# Patient Record
Sex: Female | Born: 1965
Health system: Southern US, Community
[De-identification: ages and names within clinical notes are randomized; demographics above are authoritative.]

## PROBLEM LIST (undated history)

## (undated) DIAGNOSIS — F32A Depression, unspecified: Secondary | ICD-10-CM

## (undated) DIAGNOSIS — M719 Bursopathy, unspecified: Secondary | ICD-10-CM

## (undated) DIAGNOSIS — M722 Plantar fascial fibromatosis: Secondary | ICD-10-CM

## (undated) DIAGNOSIS — M2550 Pain in unspecified joint: Secondary | ICD-10-CM

## (undated) DIAGNOSIS — G8929 Other chronic pain: Secondary | ICD-10-CM

## (undated) DIAGNOSIS — F329 Major depressive disorder, single episode, unspecified: Secondary | ICD-10-CM

## (undated) DIAGNOSIS — N94819 Vulvodynia, unspecified: Secondary | ICD-10-CM

## (undated) HISTORY — PX: SKIN SURGERY: SHX2413

## (undated) HISTORY — DX: Depression, unspecified: F32.A

## (undated) HISTORY — DX: Major depressive disorder, single episode, unspecified: F32.9

## (undated) HISTORY — DX: Bursopathy, unspecified: M71.9

## (undated) HISTORY — DX: Pain in unspecified joint: M25.50

## (undated) HISTORY — PX: BREAST ENHANCEMENT SURGERY: SHX7

## (undated) HISTORY — DX: Plantar fascial fibromatosis: M72.2

## (undated) HISTORY — DX: Vulvodynia, unspecified: N94.819

---

## 2005-10-06 ENCOUNTER — Emergency Department (HOSPITAL_COMMUNITY): Admission: EM | Admit: 2005-10-06 | Discharge: 2005-10-06 | Payer: Self-pay | Admitting: Family Medicine

## 2005-10-10 ENCOUNTER — Ambulatory Visit (HOSPITAL_COMMUNITY): Admission: RE | Admit: 2005-10-10 | Discharge: 2005-10-10 | Payer: Self-pay | Admitting: Obstetrics & Gynecology

## 2005-10-31 ENCOUNTER — Emergency Department (HOSPITAL_COMMUNITY): Admission: EM | Admit: 2005-10-31 | Discharge: 2005-10-31 | Payer: Self-pay | Admitting: Emergency Medicine

## 2007-06-16 ENCOUNTER — Ambulatory Visit (HOSPITAL_COMMUNITY): Admission: RE | Admit: 2007-06-16 | Discharge: 2007-06-16 | Payer: Self-pay | Admitting: Obstetrics

## 2007-07-07 ENCOUNTER — Ambulatory Visit: Payer: Self-pay | Admitting: Family Medicine

## 2007-08-14 DIAGNOSIS — L708 Other acne: Secondary | ICD-10-CM | POA: Insufficient documentation

## 2007-08-14 DIAGNOSIS — Z87440 Personal history of urinary (tract) infections: Secondary | ICD-10-CM | POA: Insufficient documentation

## 2007-12-04 ENCOUNTER — Emergency Department (HOSPITAL_COMMUNITY): Admission: EM | Admit: 2007-12-04 | Discharge: 2007-12-04 | Payer: Self-pay | Admitting: Family Medicine

## 2007-12-04 ENCOUNTER — Telehealth (INDEPENDENT_AMBULATORY_CARE_PROVIDER_SITE_OTHER): Payer: Self-pay | Admitting: *Deleted

## 2007-12-15 ENCOUNTER — Ambulatory Visit: Payer: Self-pay | Admitting: *Deleted

## 2008-07-02 ENCOUNTER — Telehealth (INDEPENDENT_AMBULATORY_CARE_PROVIDER_SITE_OTHER): Payer: Self-pay | Admitting: Family Medicine

## 2008-07-21 ENCOUNTER — Ambulatory Visit (HOSPITAL_COMMUNITY): Admission: RE | Admit: 2008-07-21 | Discharge: 2008-07-21 | Payer: Self-pay | Admitting: Family Medicine

## 2008-07-27 ENCOUNTER — Encounter (INDEPENDENT_AMBULATORY_CARE_PROVIDER_SITE_OTHER): Payer: Self-pay | Admitting: Family Medicine

## 2008-07-27 ENCOUNTER — Encounter: Admission: RE | Admit: 2008-07-27 | Discharge: 2008-07-27 | Payer: Self-pay | Admitting: Family Medicine

## 2008-07-29 DIAGNOSIS — N63 Unspecified lump in unspecified breast: Secondary | ICD-10-CM | POA: Insufficient documentation

## 2008-10-11 ENCOUNTER — Ambulatory Visit: Payer: Self-pay | Admitting: Family Medicine

## 2008-10-11 DIAGNOSIS — F411 Generalized anxiety disorder: Secondary | ICD-10-CM | POA: Insufficient documentation

## 2008-10-11 DIAGNOSIS — N390 Urinary tract infection, site not specified: Secondary | ICD-10-CM | POA: Insufficient documentation

## 2008-10-11 LAB — CONVERTED CEMR LAB: Pap Smear: NORMAL

## 2008-11-23 ENCOUNTER — Ambulatory Visit: Payer: Self-pay | Admitting: Family Medicine

## 2008-11-23 LAB — CONVERTED CEMR LAB
ALT: 29 units/L (ref 0–35)
AST: 29 units/L (ref 0–37)
Albumin: 4.2 g/dL (ref 3.5–5.2)
Alkaline Phosphatase: 34 units/L — ABNORMAL LOW (ref 39–117)
BUN: 17 mg/dL (ref 6–23)
Basophils Absolute: 0 10*3/uL (ref 0.0–0.1)
Basophils Relative: 1 % (ref 0–1)
CO2: 22 meq/L (ref 19–32)
Calcium: 9.2 mg/dL (ref 8.4–10.5)
Chloride: 108 meq/L (ref 96–112)
Cholesterol: 216 mg/dL — ABNORMAL HIGH (ref 0–200)
Creatinine, Ser: 0.84 mg/dL (ref 0.40–1.20)
Eosinophils Absolute: 0 10*3/uL (ref 0.0–0.7)
Eosinophils Relative: 1 % (ref 0–5)
Glucose, Bld: 89 mg/dL (ref 70–99)
HCT: 41.3 % (ref 36.0–46.0)
HDL: 71 mg/dL (ref >39)
Hemoglobin: 13.5 g/dL (ref 12.0–15.0)
LDL Cholesterol: 126 mg/dL — ABNORMAL HIGH (ref 0–99)
Lymphocytes Relative: 25 % (ref 12–46)
Lymphs Abs: 1.1 10*3/uL (ref 0.7–4.0)
MCHC: 32.7 g/dL (ref 30.0–36.0)
MCV: 97.4 fL (ref 78.0–100.0)
Monocytes Absolute: 0.4 10*3/uL (ref 0.1–1.0)
Monocytes Relative: 10 % (ref 3–12)
Neutro Abs: 2.7 10*3/uL (ref 1.7–7.7)
Neutrophils Relative %: 64 % (ref 43–77)
Platelets: 179 10*3/uL (ref 150–400)
Potassium: 4.4 meq/L (ref 3.5–5.3)
RBC: 4.24 M/uL (ref 3.87–5.11)
RDW: 13.8 % (ref 11.5–15.5)
Sodium: 142 meq/L (ref 135–145)
TSH: 2.286 microintl units/mL (ref 0.350–4.50)
Total Bilirubin: 0.4 mg/dL (ref 0.3–1.2)
Total CHOL/HDL Ratio: 3
Total Protein: 6.6 g/dL (ref 6.0–8.3)
Triglycerides: 94 mg/dL (ref <150)
VLDL: 19 mg/dL (ref 0–40)
WBC: 4.3 10*3/uL (ref 4.0–10.5)

## 2008-11-24 ENCOUNTER — Encounter (INDEPENDENT_AMBULATORY_CARE_PROVIDER_SITE_OTHER): Payer: Self-pay | Admitting: Family Medicine

## 2008-12-03 ENCOUNTER — Telehealth (INDEPENDENT_AMBULATORY_CARE_PROVIDER_SITE_OTHER): Payer: Self-pay | Admitting: Family Medicine

## 2008-12-19 ENCOUNTER — Emergency Department (HOSPITAL_COMMUNITY): Admission: EM | Admit: 2008-12-19 | Discharge: 2008-12-20 | Payer: Self-pay | Admitting: Emergency Medicine

## 2009-01-28 ENCOUNTER — Inpatient Hospital Stay (HOSPITAL_COMMUNITY): Admission: AD | Admit: 2009-01-28 | Discharge: 2009-01-28 | Payer: Self-pay | Admitting: Obstetrics and Gynecology

## 2009-01-28 ENCOUNTER — Emergency Department (HOSPITAL_COMMUNITY): Admission: EM | Admit: 2009-01-28 | Discharge: 2009-01-28 | Payer: Self-pay | Admitting: Emergency Medicine

## 2009-01-31 ENCOUNTER — Inpatient Hospital Stay (HOSPITAL_COMMUNITY): Admission: AD | Admit: 2009-01-31 | Discharge: 2009-01-31 | Payer: Self-pay | Admitting: Obstetrics & Gynecology

## 2009-01-31 ENCOUNTER — Ambulatory Visit: Payer: Self-pay | Admitting: Obstetrics & Gynecology

## 2009-02-23 ENCOUNTER — Ambulatory Visit: Payer: Self-pay | Admitting: Obstetrics & Gynecology

## 2009-02-23 ENCOUNTER — Encounter: Payer: Self-pay | Admitting: Physician Assistant

## 2009-02-23 LAB — CONVERTED CEMR LAB
Clue Cells Wet Prep HPF POC: NONE SEEN
Trich, Wet Prep: NONE SEEN
WBC, Wet Prep HPF POC: NONE SEEN
Yeast Wet Prep HPF POC: NONE SEEN

## 2009-02-24 ENCOUNTER — Encounter: Admission: RE | Admit: 2009-02-24 | Discharge: 2009-02-24 | Payer: Self-pay | Admitting: Family Medicine

## 2009-02-28 ENCOUNTER — Telehealth (INDEPENDENT_AMBULATORY_CARE_PROVIDER_SITE_OTHER): Payer: Self-pay | Admitting: Family Medicine

## 2009-03-01 ENCOUNTER — Inpatient Hospital Stay (HOSPITAL_COMMUNITY): Admission: AD | Admit: 2009-03-01 | Discharge: 2009-03-01 | Payer: Self-pay | Admitting: Obstetrics & Gynecology

## 2009-03-01 ENCOUNTER — Ambulatory Visit: Payer: Self-pay | Admitting: Obstetrics and Gynecology

## 2009-03-04 ENCOUNTER — Emergency Department (HOSPITAL_COMMUNITY): Admission: EM | Admit: 2009-03-04 | Discharge: 2009-03-04 | Payer: Self-pay | Admitting: Emergency Medicine

## 2009-03-21 ENCOUNTER — Emergency Department (HOSPITAL_COMMUNITY): Admission: EM | Admit: 2009-03-21 | Discharge: 2009-03-21 | Payer: Self-pay | Admitting: Emergency Medicine

## 2009-03-21 ENCOUNTER — Telehealth (INDEPENDENT_AMBULATORY_CARE_PROVIDER_SITE_OTHER): Payer: Self-pay | Admitting: Family Medicine

## 2009-03-25 ENCOUNTER — Ambulatory Visit: Payer: Self-pay | Admitting: Obstetrics & Gynecology

## 2009-03-25 ENCOUNTER — Encounter: Payer: Self-pay | Admitting: Family

## 2009-03-25 LAB — CONVERTED CEMR LAB
Clue Cells Wet Prep HPF POC: NONE SEEN
Trich, Wet Prep: NONE SEEN
WBC, Wet Prep HPF POC: NONE SEEN
Yeast Wet Prep HPF POC: NONE SEEN

## 2009-04-14 ENCOUNTER — Ambulatory Visit: Payer: Self-pay | Admitting: Obstetrics and Gynecology

## 2009-04-15 ENCOUNTER — Encounter: Payer: Self-pay | Admitting: Family Medicine

## 2009-04-15 LAB — CONVERTED CEMR LAB
Trich, Wet Prep: NONE SEEN
WBC, Wet Prep HPF POC: NONE SEEN
Yeast Wet Prep HPF POC: NONE SEEN

## 2009-04-20 ENCOUNTER — Ambulatory Visit: Payer: Self-pay | Admitting: Obstetrics and Gynecology

## 2009-04-22 ENCOUNTER — Ambulatory Visit: Payer: Self-pay | Admitting: Nurse Practitioner

## 2009-04-22 DIAGNOSIS — N94819 Vulvodynia, unspecified: Secondary | ICD-10-CM | POA: Insufficient documentation

## 2009-04-22 DIAGNOSIS — B379 Candidiasis, unspecified: Secondary | ICD-10-CM | POA: Insufficient documentation

## 2009-04-22 LAB — CONVERTED CEMR LAB
ALT: 13 units/L (ref 0–35)
AST: 25 units/L (ref 0–37)
Albumin: 4.8 g/dL (ref 3.5–5.2)
Alkaline Phosphatase: 30 units/L — ABNORMAL LOW (ref 39–117)
BUN: 10 mg/dL (ref 6–23)
Basophils Absolute: 0 10*3/uL (ref 0.0–0.1)
Basophils Relative: 0 % (ref 0–1)
CA 125: 15.7 units/mL (ref 0.0–30.2)
CO2: 22 meq/L (ref 19–32)
Calcium: 9.6 mg/dL (ref 8.4–10.5)
Chloride: 104 meq/L (ref 96–112)
Cholesterol: 235 mg/dL — ABNORMAL HIGH (ref 0–200)
Creatinine, Ser: 0.81 mg/dL (ref 0.40–1.20)
Eosinophils Absolute: 0 10*3/uL (ref 0.0–0.7)
Eosinophils Relative: 0 % (ref 0–5)
FSH: 1.7 milliintl units/mL
Glucose, Bld: 88 mg/dL (ref 70–99)
HCT: 42.2 % (ref 36.0–46.0)
HDL: 82 mg/dL (ref >39)
Hemoglobin: 14.5 g/dL (ref 12.0–15.0)
LDL Cholesterol: 140 mg/dL — ABNORMAL HIGH (ref 0–99)
Lymphocytes Relative: 29 % (ref 12–46)
Lymphs Abs: 1.2 10*3/uL (ref 0.7–4.0)
MCHC: 34.4 g/dL (ref 30.0–36.0)
MCV: 92.7 fL (ref 78.0–100.0)
Monocytes Absolute: 0.3 10*3/uL (ref 0.1–1.0)
Monocytes Relative: 7 % (ref 3–12)
Neutro Abs: 2.6 10*3/uL (ref 1.7–7.7)
Neutrophils Relative %: 64 % (ref 43–77)
Platelets: 196 10*3/uL (ref 150–400)
Potassium: 5.3 meq/L (ref 3.5–5.3)
Progesterone: 15.8 ng/mL
RBC: 4.55 M/uL (ref 3.87–5.11)
RDW: 13.7 % (ref 11.5–15.5)
Sodium: 141 meq/L (ref 135–145)
TSH: 1.148 microintl units/mL (ref 0.350–4.500)
Total Bilirubin: 0.8 mg/dL (ref 0.3–1.2)
Total CHOL/HDL Ratio: 2.9
Total Protein: 7.3 g/dL (ref 6.0–8.3)
Triglycerides: 63 mg/dL (ref <150)
VLDL: 13 mg/dL (ref 0–40)
WBC: 4 10*3/uL (ref 4.0–10.5)

## 2009-05-02 ENCOUNTER — Telehealth (INDEPENDENT_AMBULATORY_CARE_PROVIDER_SITE_OTHER): Payer: Self-pay | Admitting: Nurse Practitioner

## 2009-05-02 DIAGNOSIS — R109 Unspecified abdominal pain: Secondary | ICD-10-CM | POA: Insufficient documentation

## 2009-05-04 ENCOUNTER — Ambulatory Visit: Payer: Self-pay | Admitting: Obstetrics and Gynecology

## 2009-05-04 ENCOUNTER — Encounter (INDEPENDENT_AMBULATORY_CARE_PROVIDER_SITE_OTHER): Payer: Self-pay | Admitting: Nurse Practitioner

## 2009-05-09 ENCOUNTER — Ambulatory Visit (HOSPITAL_COMMUNITY): Admission: RE | Admit: 2009-05-09 | Discharge: 2009-05-09 | Payer: Self-pay | Admitting: Internal Medicine

## 2009-05-11 ENCOUNTER — Telehealth (INDEPENDENT_AMBULATORY_CARE_PROVIDER_SITE_OTHER): Payer: Self-pay | Admitting: Nurse Practitioner

## 2009-05-16 ENCOUNTER — Ambulatory Visit: Payer: Self-pay | Admitting: Nurse Practitioner

## 2009-06-01 ENCOUNTER — Ambulatory Visit: Payer: Self-pay | Admitting: Obstetrics and Gynecology

## 2009-06-09 ENCOUNTER — Encounter (INDEPENDENT_AMBULATORY_CARE_PROVIDER_SITE_OTHER): Payer: Self-pay | Admitting: Nurse Practitioner

## 2009-08-09 ENCOUNTER — Encounter (INDEPENDENT_AMBULATORY_CARE_PROVIDER_SITE_OTHER): Payer: Self-pay | Admitting: Nurse Practitioner

## 2009-09-07 ENCOUNTER — Ambulatory Visit: Payer: Self-pay | Admitting: Nurse Practitioner

## 2009-09-07 DIAGNOSIS — F3289 Other specified depressive episodes: Secondary | ICD-10-CM | POA: Insufficient documentation

## 2009-09-07 DIAGNOSIS — F329 Major depressive disorder, single episode, unspecified: Secondary | ICD-10-CM | POA: Insufficient documentation

## 2009-09-07 LAB — CONVERTED CEMR LAB
Bilirubin Urine: NEGATIVE
Blood in Urine, dipstick: NEGATIVE
Glucose, Urine, Semiquant: NEGATIVE
KOH Prep: NEGATIVE
Ketones, urine, test strip: NEGATIVE
Nitrite: NEGATIVE
Protein, U semiquant: NEGATIVE
Specific Gravity, Urine: 1.01
Urobilinogen, UA: 0.2
WBC Urine, dipstick: NEGATIVE
pH: 7.5

## 2010-01-02 ENCOUNTER — Ambulatory Visit: Payer: Self-pay | Admitting: Nurse Practitioner

## 2010-01-02 DIAGNOSIS — E7800 Pure hypercholesterolemia, unspecified: Secondary | ICD-10-CM | POA: Insufficient documentation

## 2010-01-02 LAB — CONVERTED CEMR LAB
Bilirubin Urine: NEGATIVE
Blood in Urine, dipstick: NEGATIVE
Glucose, Urine, Semiquant: NEGATIVE
KOH Prep: NEGATIVE
Ketones, urine, test strip: NEGATIVE
Nitrite: NEGATIVE
Protein, U semiquant: NEGATIVE
Specific Gravity, Urine: 1.01
Urobilinogen, UA: 0.2
WBC Urine, dipstick: NEGATIVE
pH: 7.5

## 2010-01-04 ENCOUNTER — Encounter (INDEPENDENT_AMBULATORY_CARE_PROVIDER_SITE_OTHER): Payer: Self-pay | Admitting: Nurse Practitioner

## 2010-01-04 LAB — CONVERTED CEMR LAB
ALT: 23 units/L (ref 0–35)
AST: 27 units/L (ref 0–37)
Albumin: 4.7 g/dL (ref 3.5–5.2)
Alkaline Phosphatase: 29 units/L — ABNORMAL LOW (ref 39–117)
BUN: 13 mg/dL (ref 6–23)
CO2: 26 meq/L (ref 19–32)
Calcium: 9.7 mg/dL (ref 8.4–10.5)
Chloride: 102 meq/L (ref 96–112)
Cholesterol: 229 mg/dL — ABNORMAL HIGH (ref 0–200)
Creatinine, Ser: 0.86 mg/dL (ref 0.40–1.20)
Glucose, Bld: 84 mg/dL (ref 70–99)
HDL: 75 mg/dL (ref >39)
LDL Cholesterol: 138 mg/dL — ABNORMAL HIGH (ref 0–99)
Potassium: 4.7 meq/L (ref 3.5–5.3)
Sodium: 139 meq/L (ref 135–145)
Total Bilirubin: 0.3 mg/dL (ref 0.3–1.2)
Total CHOL/HDL Ratio: 3.1
Total Protein: 7.1 g/dL (ref 6.0–8.3)
Triglycerides: 81 mg/dL (ref <150)
VLDL: 16 mg/dL (ref 0–40)

## 2010-01-12 ENCOUNTER — Ambulatory Visit (HOSPITAL_COMMUNITY): Admission: RE | Admit: 2010-01-12 | Discharge: 2010-01-12 | Payer: Self-pay | Admitting: Internal Medicine

## 2010-01-25 ENCOUNTER — Ambulatory Visit: Payer: Self-pay | Admitting: Nurse Practitioner

## 2010-01-25 LAB — CONVERTED CEMR LAB
Cholesterol, target level: 200 mg/dL
HDL goal, serum: 40 mg/dL
LDL Goal: 160 mg/dL

## 2010-03-01 ENCOUNTER — Ambulatory Visit: Payer: Self-pay | Admitting: Nurse Practitioner

## 2010-03-01 DIAGNOSIS — N760 Acute vaginitis: Secondary | ICD-10-CM | POA: Insufficient documentation

## 2010-03-01 LAB — CONVERTED CEMR LAB
Bilirubin Urine: NEGATIVE
Blood in Urine, dipstick: NEGATIVE
Glucose, Urine, Semiquant: NEGATIVE
KOH Prep: NEGATIVE
Ketones, urine, test strip: NEGATIVE
Nitrite: NEGATIVE
Protein, U semiquant: NEGATIVE
Specific Gravity, Urine: 1.015
Urobilinogen, UA: 0.2
WBC Urine, dipstick: NEGATIVE
pH: 7

## 2010-03-09 ENCOUNTER — Telehealth (INDEPENDENT_AMBULATORY_CARE_PROVIDER_SITE_OTHER): Payer: Self-pay | Admitting: Nurse Practitioner

## 2010-03-10 ENCOUNTER — Telehealth (INDEPENDENT_AMBULATORY_CARE_PROVIDER_SITE_OTHER): Payer: Self-pay | Admitting: Nurse Practitioner

## 2010-03-14 ENCOUNTER — Telehealth (INDEPENDENT_AMBULATORY_CARE_PROVIDER_SITE_OTHER): Payer: Self-pay | Admitting: Nurse Practitioner

## 2010-03-21 ENCOUNTER — Ambulatory Visit: Payer: Self-pay | Admitting: Nurse Practitioner

## 2010-03-21 LAB — CONVERTED CEMR LAB
Bilirubin Urine: NEGATIVE
Blood in Urine, dipstick: NEGATIVE
Glucose, Urine, Semiquant: NEGATIVE
KOH Prep: NEGATIVE
Ketones, urine, test strip: NEGATIVE
Nitrite: NEGATIVE
Protein, U semiquant: NEGATIVE
Specific Gravity, Urine: <1.005
Urobilinogen, UA: 0.2
WBC Urine, dipstick: NEGATIVE
pH: 7

## 2010-03-27 LAB — CONVERTED CEMR LAB: Pap Smear: ABNORMAL

## 2010-03-31 ENCOUNTER — Encounter: Payer: Self-pay | Admitting: Family

## 2010-03-31 ENCOUNTER — Ambulatory Visit: Payer: Self-pay | Admitting: Obstetrics & Gynecology

## 2010-03-31 LAB — CONVERTED CEMR LAB
Clue Cells Wet Prep HPF POC: NONE SEEN
Trich, Wet Prep: NONE SEEN
WBC, Wet Prep HPF POC: NONE SEEN
Yeast Wet Prep HPF POC: NONE SEEN

## 2010-04-03 ENCOUNTER — Telehealth (INDEPENDENT_AMBULATORY_CARE_PROVIDER_SITE_OTHER): Payer: Self-pay | Admitting: Nurse Practitioner

## 2010-04-21 ENCOUNTER — Ambulatory Visit: Payer: Self-pay | Admitting: Obstetrics & Gynecology

## 2010-04-21 ENCOUNTER — Encounter (INDEPENDENT_AMBULATORY_CARE_PROVIDER_SITE_OTHER): Payer: Self-pay | Admitting: Nurse Practitioner

## 2010-04-21 ENCOUNTER — Other Ambulatory Visit: Admission: RE | Admit: 2010-04-21 | Discharge: 2010-04-21 | Payer: Self-pay | Admitting: Obstetrics and Gynecology

## 2010-04-27 ENCOUNTER — Telehealth (INDEPENDENT_AMBULATORY_CARE_PROVIDER_SITE_OTHER): Payer: Self-pay | Admitting: Nurse Practitioner

## 2010-04-27 ENCOUNTER — Ambulatory Visit: Payer: Self-pay | Admitting: Nurse Practitioner

## 2010-04-28 ENCOUNTER — Telehealth (INDEPENDENT_AMBULATORY_CARE_PROVIDER_SITE_OTHER): Payer: Self-pay | Admitting: Nurse Practitioner

## 2010-05-12 ENCOUNTER — Ambulatory Visit: Payer: Self-pay | Admitting: Obstetrics & Gynecology

## 2010-05-15 ENCOUNTER — Telehealth (INDEPENDENT_AMBULATORY_CARE_PROVIDER_SITE_OTHER): Payer: Self-pay | Admitting: Nurse Practitioner

## 2010-06-23 ENCOUNTER — Ambulatory Visit: Payer: Self-pay | Admitting: Nurse Practitioner

## 2010-07-06 ENCOUNTER — Telehealth (INDEPENDENT_AMBULATORY_CARE_PROVIDER_SITE_OTHER): Payer: Self-pay | Admitting: Nurse Practitioner

## 2010-07-07 ENCOUNTER — Ambulatory Visit: Payer: Self-pay | Admitting: Nurse Practitioner

## 2010-07-07 LAB — CONVERTED CEMR LAB
Bilirubin Urine: NEGATIVE
Blood in Urine, dipstick: NEGATIVE
Glucose, Urine, Semiquant: NEGATIVE
KOH Prep: NEGATIVE
Ketones, urine, test strip: NEGATIVE
Nitrite: NEGATIVE
Protein, U semiquant: NEGATIVE
Specific Gravity, Urine: <1.005
Urobilinogen, UA: 0.2
WBC Urine, dipstick: NEGATIVE
pH: 6.5

## 2010-08-08 ENCOUNTER — Telehealth (INDEPENDENT_AMBULATORY_CARE_PROVIDER_SITE_OTHER): Payer: Self-pay | Admitting: Nurse Practitioner

## 2010-09-22 ENCOUNTER — Ambulatory Visit: Payer: Self-pay | Admitting: Nurse Practitioner

## 2010-10-06 ENCOUNTER — Ambulatory Visit: Payer: Self-pay | Admitting: Nurse Practitioner

## 2010-10-06 LAB — CONVERTED CEMR LAB
Bilirubin Urine: NEGATIVE
Blood in Urine, dipstick: NEGATIVE
Glucose, Urine, Semiquant: NEGATIVE
KOH Prep: NEGATIVE
Ketones, urine, test strip: NEGATIVE
Nitrite: NEGATIVE
Protein, U semiquant: NEGATIVE
Specific Gravity, Urine: <1.005
Urobilinogen, UA: 0.2
WBC Urine, dipstick: NEGATIVE
Whiff Test: NEGATIVE
pH: 7

## 2010-11-01 LAB — CONVERTED CEMR LAB
Bilirubin Urine: NEGATIVE
Blood in Urine, dipstick: NEGATIVE
Glucose, Urine, Semiquant: NEGATIVE
KOH Prep: NEGATIVE
Ketones, urine, test strip: NEGATIVE
Nitrite: NEGATIVE
Protein, U semiquant: NEGATIVE
Specific Gravity, Urine: 1.01
Urobilinogen, UA: 0.2
WBC Urine, dipstick: NEGATIVE
pH: 8

## 2010-11-02 ENCOUNTER — Encounter (INDEPENDENT_AMBULATORY_CARE_PROVIDER_SITE_OTHER): Payer: Self-pay | Admitting: Nurse Practitioner

## 2010-11-03 ENCOUNTER — Ambulatory Visit: Payer: Self-pay | Admitting: Nurse Practitioner

## 2010-11-21 IMAGING — CT CT ANGIO CHEST
2 of 7 series · 19 of 36 positions shown · IV contrast (APPLIED)
Comparison: None available.

CLINICAL DATA: Shortness of breath.  Recent surgery.

CT ANGIOGRAPHY CHEST
TECHNIQUE: Multidetector CT imaging of the chest using the
standard protocol during bolus administration of intravenous
contrast. Multiplanar reconstructed images including MIPs were
obtained and reviewed to evaluate the vascular anatomy.
Contrast: 80 ml [id number].

[Series 7: pulm embolism 1.0 thins · axial · 0.63mm/px · z∈[+1080,+1362]mm · 18 of 315 slices shown]
[im 17/315  lung]
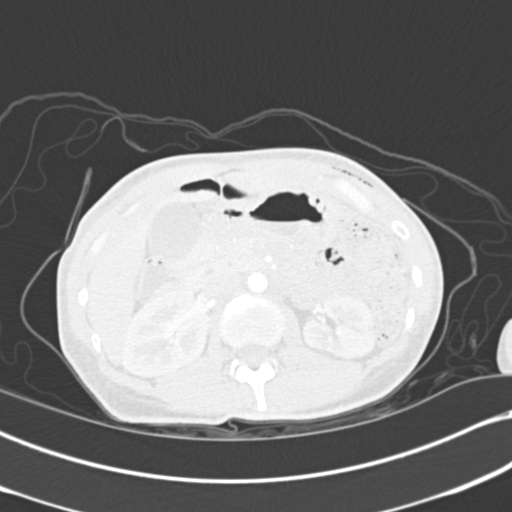
[im 34/315  mediastinal]
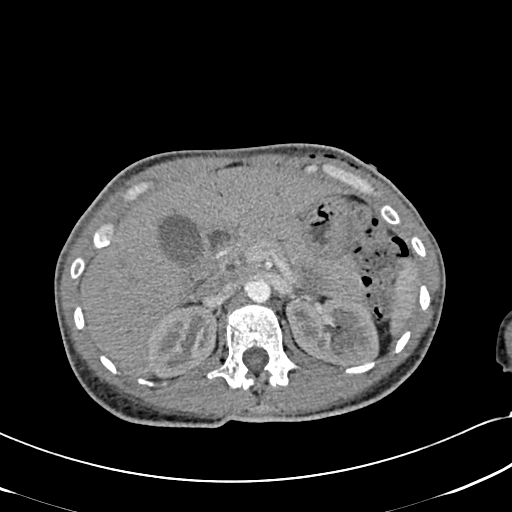
[im 50/315  lung]
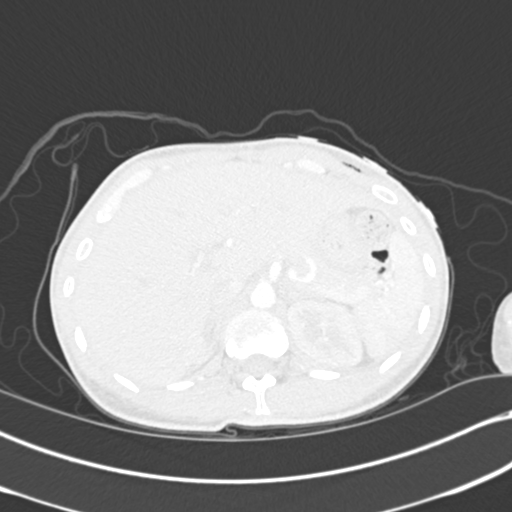
[im 67/315  mediastinal]
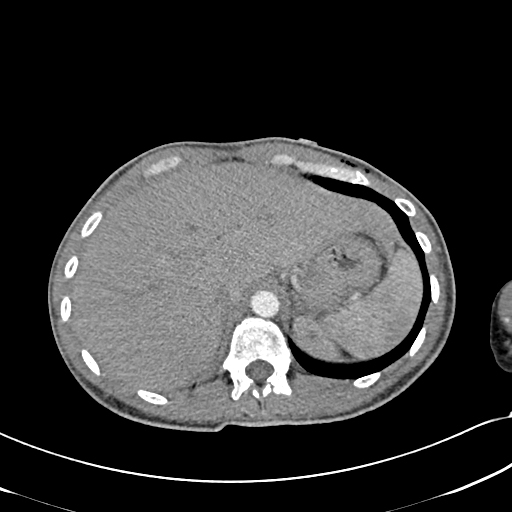
[im 83/315  lung]
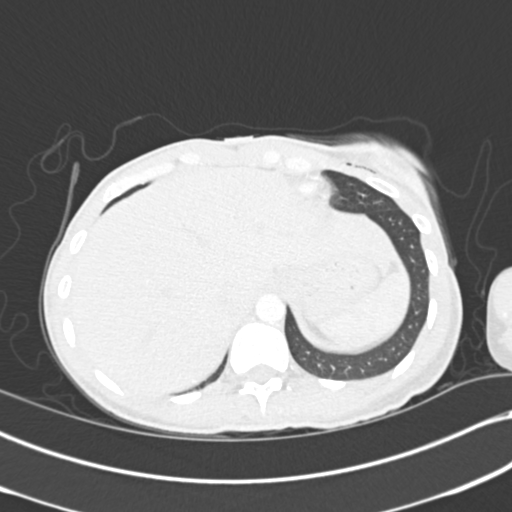
[im 100/315  mediastinal]
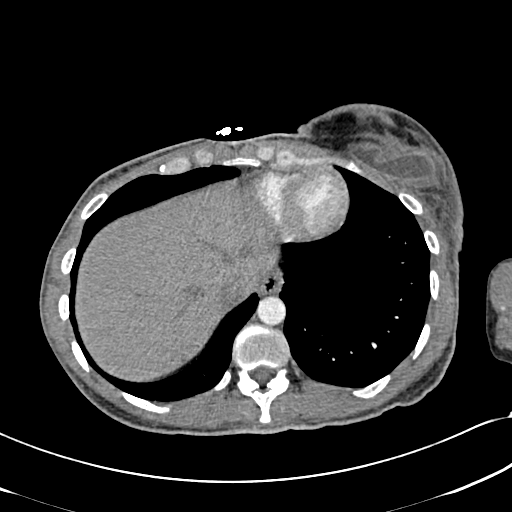
[im 116/315  lung]
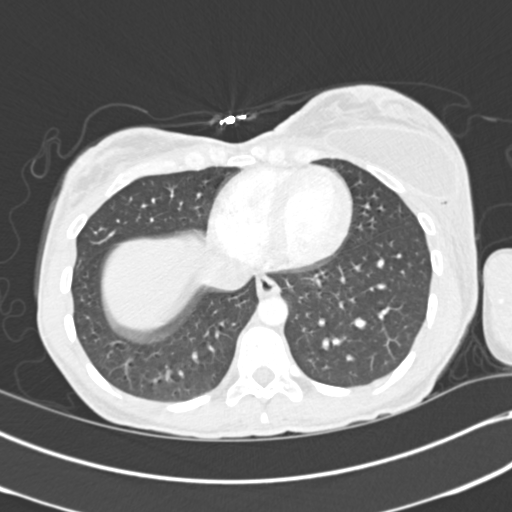
[im 133/315  mediastinal]
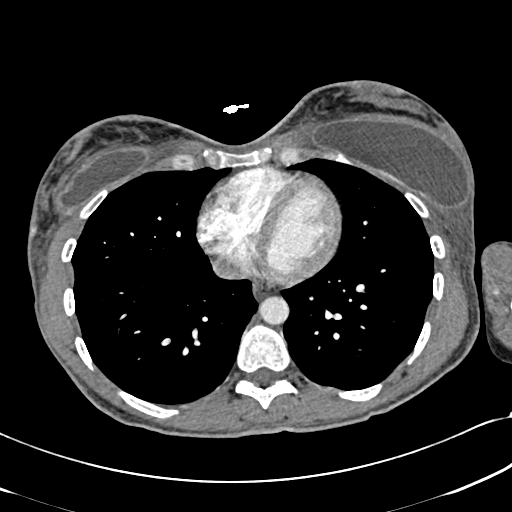
[im 149/315  lung]
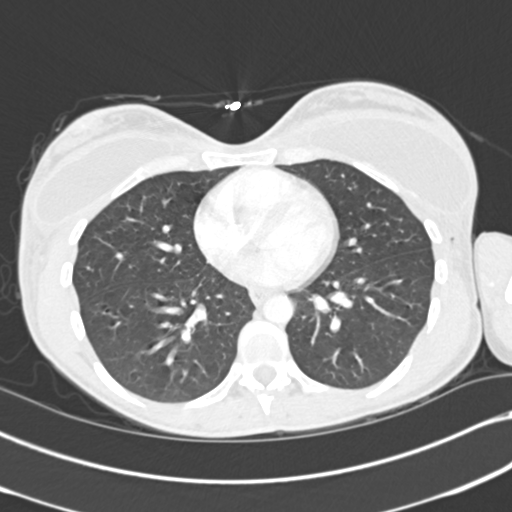
[im 166/315  mediastinal]
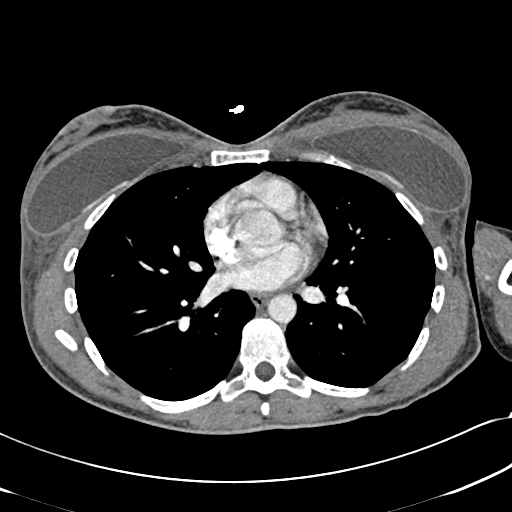
[im 182/315  lung]
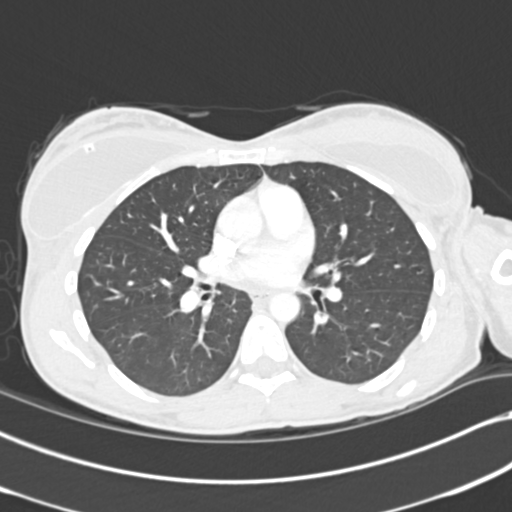
[im 199/315  mediastinal]
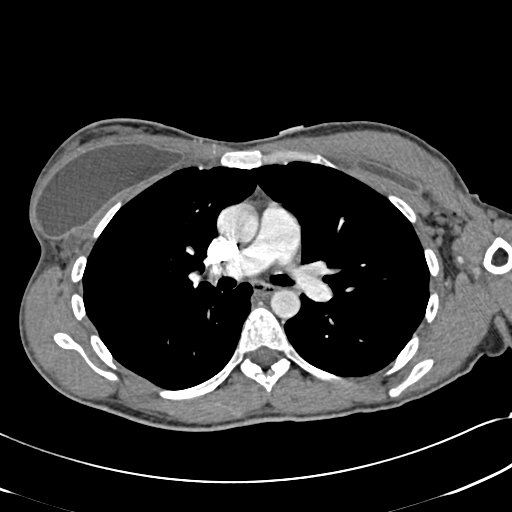
[im 215/315  lung]
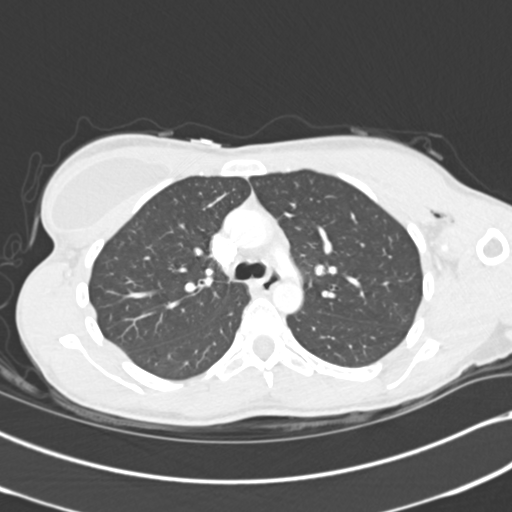
[im 232/315  mediastinal]
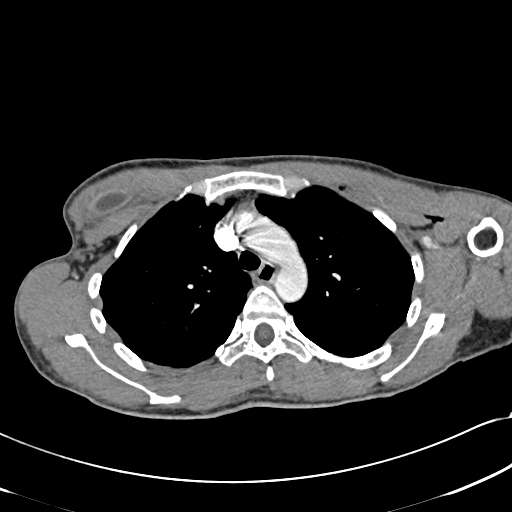
[im 248/315  lung]
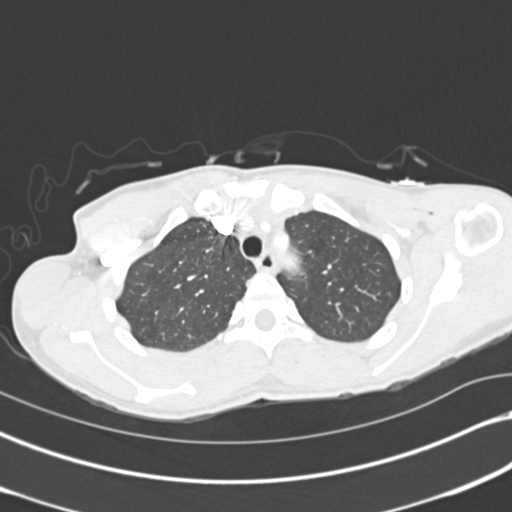
[im 265/315  mediastinal]
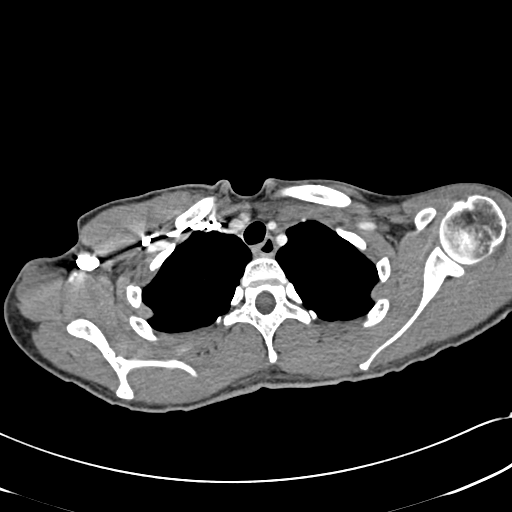
[im 281/315  lung]
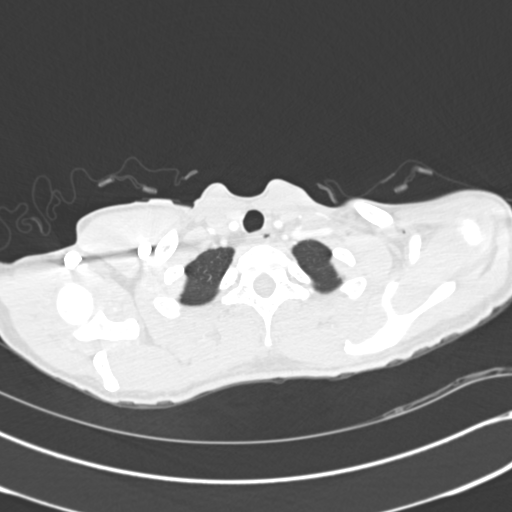
[im 298/315  mediastinal]
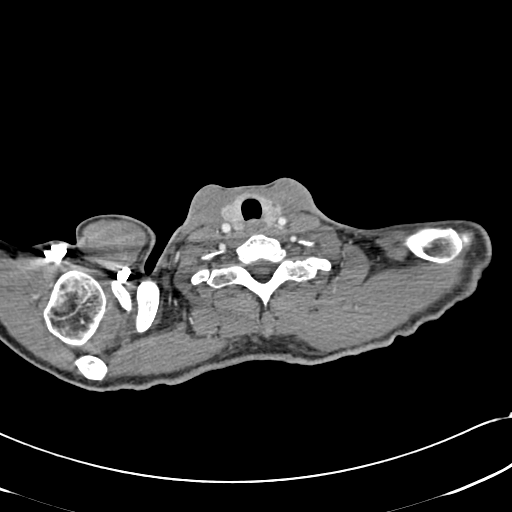

[Series 9: pulm embolism 2.0 cor · coronal · 0.64mm/px · 1 of 108 slices shown]
[im 54/108  mediastinal]
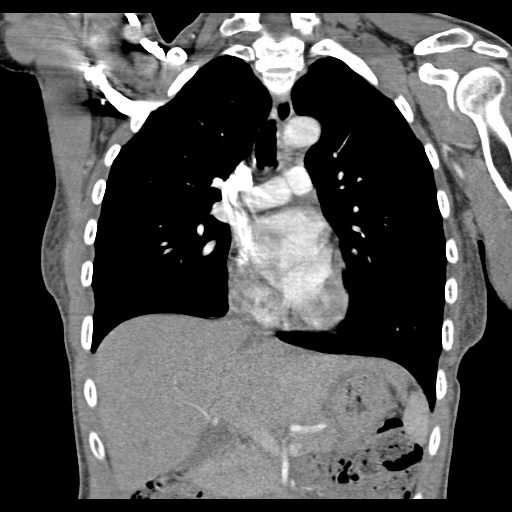

[19 of 36 positions shown; findings below may reference images not displayed]

FINDINGS: There is no CT evidence of pulmonary embolus.  The
patient has bilateral breast implants.  There is some air in the
subcutaneous soft tissues of the left chest wall with stranding in
the subcutaneous tissues bilaterally likely due to recent breast
augmentation surgery.  No focal fluid collection is identified.
There is no pleural or pericardial effusion.  Heart size is normal.
No axillary, hilar or mediastinal lymphadenopathy.  Lungs are
clear.  The airway is unremarkable.  Incidentally imaged upper
abdomen is unremarkable.
IMPRESSION: 1.  Negative for pulmonary embolus or acute cardiopulmonary
disease.
2.  Stranding in the anterior chest wall with gas in the left noted
consistent with recent surgery.

## 2010-11-30 ENCOUNTER — Telehealth (INDEPENDENT_AMBULATORY_CARE_PROVIDER_SITE_OTHER): Payer: Self-pay | Admitting: Nurse Practitioner

## 2010-12-26 NOTE — Progress Notes (Signed)
Summary: MEDICATION REFILL  Phone Note Call from Patient   Summary of Call: PT STATES TRAMADOL RX THAT WAS CHANGED SHE WANTED REFILLS. PER [facility] PT JUST NEEDS TO CALL WHEN SHE IS ABOUT OUT SO SHE WILL AT LEAST KNOW HOW THE MEDICATION IS WORKING.... Initial call taken by: Mikey College CMA,  March 10, 2010 3:55 PM  Follow-up for Phone Call        pt is aware............... Follow-up by: Mikey College CMA,  March 14, 2010 9:20 AM

## 2010-12-26 NOTE — Assessment & Plan Note (Signed)
Summary: Vulvodynia   Vital Signs:  Patient profile:   45 year old female LMP:     03/10/2010 Weight:      109.5 pounds BMI:     22.01 BSA:     1.43 Temp:     98.0 degrees F oral Pulse rate:   80 / minute Pulse rhythm:   regular Resp:     20 per minute BP sitting:   130 / 84  (left arm) Cuff size:   regular  Vitals Entered By: Levon Hedger (March 21, 2010 12:25 PM) CC: burning in vaginal area x 4 days Is Patient Diabetic? No Pain Assessment Patient in pain? yes     Location: vagina  Does patient need assistance? Functional Status Self care Ambulation Normal LMP (date): 03/10/2010 LMP - Character: heavy     Enter LMP: 03/10/2010 Last PAP Result done at [facility]    CC:  burning in vaginal area x 4 days.  History of Present Illness:  Pt into the office with c/o vaginosis. Pt reports that she has been taking around the clock tramadol and oxycodone. Tramadol was increased to three times per day on last week.  **Pt was treated in this office for bacterial vaginosis (03/01/2010)with metroconazole and diflucan as previously ordered** May 6th - pt has an appt at [facility] for yearly PAP. Trazodone was increased to three times per day under the advisement of this provider pt has tried several other medications in the past for vulvodynia  to no avail.  she has also been to urology who eventually ran out of treatment options    Medications Prior to Update: 1)  Oxycodone-Acetaminophen 5-325 Mg Tabs (Oxycodone-Acetaminophen) .Marland Kitchen.. 1-2 Tablet By Mouth Daily As Needed Only For Extreme Pain 2)  Lidocaine 5 % Oint (Lidocaine) .... Apply Two Times A Day To Affected Area 3)  Tramadol Hcl 50 Mg Tabs (Tramadol Hcl) .... One Tablet By Mouth Three Times A Day As Needed 4)  Pravastatin Sodium 10 Mg Tabs (Pravastatin Sodium) .... Hold 5)  Retin-A 0.1 % Crea (Tretinoin) .... Apply To Face Nightly 6)  Metronidazole 500 Mg Tabs (Metronidazole) .... One Tablet By Mouth Two Times A  Day For Infection 7)  Fluconazole 200 Mg Tabs (Fluconazole) .... One Tablet Daily For 3 Days  Review of Systems GU:  Denies discharge; vaginal burning. Psych:  Complains of depression.  Physical Exam  General:  alert.   Head:  normocephalic.   Genitalia:  self wet prep Msk:  normal ROM.   Skin:  color normal.   Psych:  crying during exam   Impression & Recommendations:  Problem # 1:  VULVODYNIA UNSPECIFIED (ICD-625.70) wet prep and urine ok today advise pt to soak in warm water (plain) trazodone recent increased to three times a day - advised pt to given this time to reach maximum capacity denied request to refill pain meds today - last written 03/14/2010 Orders: UA Dipstick w/o Micro (manual) ([address]) KOH/ WET Mount [phone])  Complete Medication List: 1)  Oxycodone-acetaminophen 5-325 Mg Tabs (Oxycodone-acetaminophen) .Marland Kitchen.. 1-2 tablet by mouth daily as needed only for extreme pain 2)  Lidocaine 5 % Oint (Lidocaine) .... Apply two times a day to affected area 3)  Tramadol Hcl 50 Mg Tabs (Tramadol hcl) .... One tablet by mouth three times a day as needed 4)  Pravastatin Sodium 10 Mg Tabs (Pravastatin sodium) .... Hold 5)  Retin-a 0.1 % Crea (Tretinoin) .... Apply to face nightly  Patient Instructions: 1)  Keep your appointment  with GYN in May as previously scheduled. 2)  ok to let that provider know that you have been seen here for Bacterial vaginosis and request to be checked for any bacterial or yeast infections if possible during that visit    Laboratory Results   Urine Tests  Date/Time Received: March 21, 2010 1:22 PM   Routine Urinalysis   Color: lt. yellow Glucose: negative   (Normal Range: Negative) Bilirubin: negative   (Normal Range: Negative) Ketone: negative   (Normal Range: Negative) Spec. Gravity: <1.005   (Normal Range: 1.003-1.035) Blood: negative   (Normal Range: Negative) pH: 7.0   (Normal Range: 5.0-8.0) Protein: negative   (Normal Range:  Negative) Urobilinogen: 0.2   (Normal Range: 0-1) Nitrite: negative   (Normal Range: Negative) Leukocyte Esterace: negative   (Normal Range: Negative)    Date/Time Received: March 21, 2010 1:17 PM   [employer] Source: vaginal WBC/hpf: 1-5 Bacteria/hpf: rare Clue cells/hpf: none Yeast/hpf: none Wet Mount KOH: Negative Trichomonas/hpf: none   Laboratory Results   Urine Tests    Routine Urinalysis   Color: lt. yellow Glucose: negative   (Normal Range: Negative) Bilirubin: negative   (Normal Range: Negative) Ketone: negative   (Normal Range: Negative) Spec. Gravity: <1.005   (Normal Range: 1.003-1.035) Blood: negative   (Normal Range: Negative) pH: 7.0   (Normal Range: 5.0-8.0) Protein: negative   (Normal Range: Negative) Urobilinogen: 0.2   (Normal Range: 0-1) Nitrite: negative   (Normal Range: Negative) Leukocyte Esterace: negative   (Normal Range: Negative)      Wet Mount/KOH

## 2010-12-26 NOTE — Assessment & Plan Note (Signed)
Summary: Depression   Vital Signs:  Patient profile:   45 year old female LMP:     04/06/2010 Height:      59.25 inches Weight:      110.6 pounds BMI:     22.23 BSA:     1.44 Pulse rate:   101 / minute Pulse rhythm:   regular Resp:     20 per minute BP sitting:   121 / 86  (left arm) Cuff size:   regular  Vitals Entered By: Levon Hedger (April 27, 2010 11:00 AM) CC: 3 month follow-up.Marland KitchenMarland Kitchenpt had colposcopy done, Depression Is Patient Diabetic? No Pain Assessment Patient in pain? no       Does patient need assistance? Functional Status Self care Ambulation Normal LMP (date): 04/06/2010 LMP - Character: heavy     Enter LMP: 04/06/2010 Last PAP Result Done by GYN - abnormal   CC:  3 month follow-up.Marland KitchenMarland Kitchenpt had colposcopy done and Depression.  History of Present Illness:  Pt into the office as previously scheduled for her vulvodynia Pt has been to GYN since her last visit and had PAP with colposcopy done last week. She will get the results in 2 weeks  Vulvodynia - started on trazodone and has increased to three times a day.  Pt was doing fairly well but reports that colposcopy flared up her irritation again    Depression History:      The patient is having a depressed mood most of the day and has a diminished interest in her usual daily activities.  Positive alarm features for depression include fatigue (loss of energy).  However, she denies insomnia and recurrent thoughts of death or suicide.        The patient denies that she feels like life is not worth living, denies that she wishes that she were dead, and denies that she has thought about ending her life.        Comments:  pt recently had an abnormal PAP which has heightened her depression. Pt has been on several anti-depressants in the past but only showed some improvement with the wellbutrin but she thought it flared her wellbutrin.  Depression Treatment History:  Prior Medication Used:   Start Date: Assessment  of Effect:   Comments:  Zoloft (sertraline)     05/13/2009   discontinued     some weight gain and tired all the time Luvox (fluvoxamine)     --       --       -- Wellbutrin (bupropion)     04/27/2010     --       will restart trazodone (generic)     12/28/2009   much improvement     I am feeling much better Elavil (amitriptyline)     03/26/2009   no improvement       discontinued   Allergies: 1)  ! Aldomet  Review of Systems CV:  Denies chest pain or discomfort. Resp:  Denies cough. GI:  Denies abdominal pain, nausea, and vomiting. Psych:  Complains of depression.  Physical Exam  General:  alert.   Head:  normocephalic.   Lungs:  normal breath sounds.   Heart:  normal rate and regular rhythm.   Msk:  normal ROM.   Neurologic:  anxious mood flat affect Skin:  color normal.   Psych:  Oriented X3.     Impression & Recommendations:  Problem # 1:  VULVODYNIA UNSPECIFIED (ICD-625.70) symptoms ongoing but currently managed with trazodone  Problem # 2:  DEPRESSION (ICD-311) will restart wellbutrin advised pt that trazodone alone is not helpful for her chronic depression pt was previously taking 300mg  but would like her to start with 150mg  for 4 weeks then taper up Her updated medication list for this problem includes:    Wellbutrin Sr 150 Mg Xr12h-tab (Bupropion hcl) ..... One tablet by mouth two times a day for 1 month then increase to 2 tablets by mouth two times a day  Complete Medication List: 1)  Oxycodone-acetaminophen 5-325 Mg Tabs (Oxycodone-acetaminophen) .Marland Kitchen.. 1-2 tablet by mouth daily as needed only for extreme pain 2)  Lidocaine 5 % Oint (Lidocaine) .... Apply two times a day to affected area 3)  Tramadol Hcl 50 Mg Tabs (Tramadol hcl) .... One tablet by mouth three times a day as needed 4)  Pravastatin Sodium 10 Mg Tabs (Pravastatin sodium) .... Hold 5)  Retin-a 0.1 % Crea (Tretinoin) .... Apply to face nightly 6)  Wellbutrin Sr 150 Mg Xr12h-tab (Bupropion hcl)  .... One tablet by mouth two times a day for 1 month then increase to 2 tablets by mouth two times a day  Patient Instructions: 1)  Wellbutrin and Trazodone have been sent to [facility] pharmacy 2)  Follow up in 2 months for mood and med review - wellbutrin Prescriptions: TRAMADOL HCL 50 MG TABS (TRAMADOL HCL) One tablet by mouth three times a day as needed  #90 x 1   Entered and Authorized by:   Lehman Prom FNP   Signed by:   Lehman Prom FNP on 04/27/2010   Method used:   Faxed to ...       [facility] (retail)       [address] [place], [place]  [address]       Ph: [phone]       Fax: [phone]   RxID:   [id number] OXYCODONE-ACETAMINOPHEN 5-325 MG TABS (OXYCODONE-ACETAMINOPHEN) 1-2 tablet by mouth daily as needed ONLY FOR EXTREME PAIN  #30 x 0   Entered and Authorized by:   Lehman Prom FNP   Signed by:   Lehman Prom FNP on 04/27/2010   Method used:   Print then Give to Patient   RxID:   [id number] WELLBUTRIN SR 150 MG XR12H-TAB (BUPROPION HCL) One tablet by mouth two times a day for 1 month then increase to 2 tablets by mouth two times a day  #60 x 5   Entered and Authorized by:   Lehman Prom FNP   Signed by:   Lehman Prom FNP on 04/27/2010   Method used:   Faxed to ...       [facility] (retail)       [address] [place], [place]  [address]       Ph: [phone]       Fax: [phone]   RxID:   [id number]

## 2010-12-26 NOTE — Letter (Signed)
Summary: TEST ORDER FORM//MAMMOGRAM//APPT DATE & TIME  TEST ORDER FORM//MAMMOGRAM//APPT DATE & TIME   Imported By: Arta Bruce 02/22/2010 14:52:53  _____________________________________________________________________  External Attachment:    Type:   Image     Comment:   External Document

## 2010-12-26 NOTE — Assessment & Plan Note (Signed)
Summary: TEST FOR UTI//MC  Nurse Visit   Vital Signs:  Patient profile:   45 year old female Temp:     97.0 degrees F oral Pulse rate:   96 / minute Pulse rhythm:   regular Resp:     20 per minute BP sitting:   130 / 92  (right arm) Cuff size:   regular  Vitals Entered By: Dutch Quint RN (October 06, 2010 3:15 PM)  Impression & Recommendations:  Problem # 1:  VULVODYNIA UNSPECIFIED (ICD-625.70) U/A and wet prep negative Having severe perineal pain continue medications as previously ordered No new medications Apply heat/cold as tolerated  Complete Medication List: 1)  Oxycodone-acetaminophen 5-325 Mg Tabs (Oxycodone-acetaminophen) .Marland Kitchen.. 1-2 tablet by mouth daily as needed only for extreme pain 2)  Lidocaine 5 % Oint (Lidocaine) .... Apply two times a day to affected area 3)  Tramadol Hcl 50 Mg Tabs (Tramadol hcl) .... One tablet by mouth three times a day as needed 4)  Pravastatin Sodium 10 Mg Tabs (Pravastatin sodium) .... Hold 5)  Retin-a 0.1 % Crea (Tretinoin) .... Apply to face nightly 6)  Fluoxetine Hcl 10 Mg Tabs (Fluoxetine hcl) .... One tablet by mouth two times a day 7)  Fluconazole 150 Mg Tabs (Fluconazole) .... One tablet by mouth x 3 8)  Ativan 1 Mg Tabs (Lorazepam) .... One tablet by mouth nightly for sleep as needed  Other Orders: KOH/ WET Mount [phone]) UA Dipstick w/o Micro (automated)  ([address]3)   Patient Instructions: 1)  Reviewed with Jesse Fall 2)  You don't have a urinary tract infection or BV. 3)  Continue medications as previously prescribed 4)  Apply heat or cold as tolerated 5)  Call if you have any questions or anything changes.   CC:  thinks she has UTI or BV.  History of Present Illness: Pain started about four days ago, pain is severe, constant and is worse after voiding.  Is taking hydrocodone for the pain.  Has history of BV and vulvodinia.  Thinks it could be either one of those or UTI -- here for U/A and wet prep.  Symptoms pain and  burning in perineal region.   Review of Systems GU:  Complains of abnormal vaginal bleeding, discharge, dysuria, nocturia, urinary frequency, and urinary hesitancy; some spotting and clear discharge.  CC: thinks she has UTI or BV Pain Assessment Patient in pain? yes     Location: perineal Intensity: 6 Type: burning,discomfort Onset of pain  about 4 days ago  Does patient need assistance? Functional Status Self care Ambulation Normal   Allergies: 1)  ! Aldomet Laboratory Results   Urine Tests  Date/Time Received: October 06, 2010 3:41 PM   Routine Urinalysis   Color: lt. yellow Glucose: negative   (Normal Range: Negative) Bilirubin: negative   (Normal Range: Negative) Ketone: negative   (Normal Range: Negative) Spec. Gravity: <1.005   (Normal Range: 1.003-1.035) Blood: negative   (Normal Range: Negative) pH: 7.0   (Normal Range: 5.0-8.0) Protein: negative   (Normal Range: Negative) Urobilinogen: 0.2   (Normal Range: 0-1) Nitrite: negative   (Normal Range: Negative) Leukocyte Esterace: negative   (Normal Range: Negative)    Date/Time Received: October 06, 2010 3:48 PM   [employer] Source: vaginal Clue cells/hpf: none  Negative whiff Yeast/hpf: none Wet Mount KOH: Negative Trichomonas/hpf: none   Orders Added: 1)  Est. Patient Level I [[address]] 2)  KOH/ WET Mount [87[address]] 3)  UA Dipstick w/o Micro (automated)  [81003]

## 2010-12-26 NOTE — Assessment & Plan Note (Signed)
Summary: Chronic Vulvodynia   Vital Signs:  Patient profile:   45 year old female LMP:     11/2009 Weight:      112.2 pounds BMI:     22.55 BSA:     1.45 Temp:     98.6 degrees F oral Pulse rate:   97 / minute Pulse rhythm:   regular Resp:     20 per minute BP sitting:   114 / 75  (left arm) Cuff size:   regular  Vitals Entered By: Levon Hedger (January 02, 2010 10:17 AM) CC: wants to be checked for yeast and or bladder infection, Depression Is Patient Diabetic? No Pain Assessment Patient in pain? no       Does patient need assistance? Functional Status Self care Ambulation Normal LMP (date): 11/2009 LMP - Character: heavy     Enter LMP: 11/2009 Last PAP Result done at [facility]    CC:  wants to be checked for yeast and or bladder infection and Depression.  History of Present Illness:  Pt into the office with a complaint of vulvodynia. She has been taking cymbalta for the past 4 months and is concerned that she is having some side effects. Side effects from cymbalta are disturbing dreams, sugar craving, ringing in her ears. Anxiety and depression symptoms are exacerbated by her sitting on the couch. Wellbutrin was good for the anxiety and depression but it did increase her vulvudynia. She has also been on amitriptyline, zoloft. She would like to stop taking the cymbalta and oxycodone and is suggesting tramadol to help with pain and depression  Mammogram - complicated left breast cysts and she required 6 month f/u Call from the breast imaging center regarding her mammogram is now due and is requesting to be scheduled    Depression History:      Positive alarm features for depression include fatigue (loss of energy).        The patient denies that she feels like life is not worth living, denies that she wishes that she were dead, and denies that she has thought about ending her life.         Depression Treatment History:  Prior Medication Used:   Start Date:  Assessment of Effect:   Comments:  Zoloft (sertraline)     05/13/2009   discontinued     some weight gain and tired all the time Luvox (fluvoxamine)     --       --       -- Elavil (amitriptyline)     03/26/2009   no improvement       changed to zolft   Habits & Providers  Alcohol-Tobacco-Diet     Alcohol drinks/day: 0     Tobacco Status: quit     Tobacco Counseling: to remain off tobacco products  Exercise-Depression-Behavior     Does Patient Exercise: no     Exercise Counseling: to improve exercise regimen     Have you felt down or hopeless? yes     Have you felt little pleasure in things? yes     Depression Counseling: further diagnostic testing and/or other treatment is indicated     Drug Use: never  Allergies: No Known Drug Allergies  Review of Systems General:  Denies fever. CV:  Denies chest pain or discomfort. Resp:  Denies cough. GI:  Denies abdominal pain, nausea, and vomiting. GU:  Complains of dysuria. Psych:  Complains of anxiety and depression.  Physical Exam  General:  alert.  Head:  normocephalic.   Ears:  ear piercing(s) noted.   Lungs:  normal breath sounds.   Heart:  normal rate and regular rhythm.   Genitalia:  self wet prep   Impression & Recommendations:  Problem # 1:  VULVODYNIA UNSPECIFIED (ICD-625.70) still chronic.  Problem # 2:  DEPRESSION (ICD-311) Pt to taper off cymbalta will start tramadol as needed  If depression still ongoing next month will perhaps add new meds The following medications were removed from the medication list:    Cymbalta 60 Mg Cpep (Duloxetine hcl) ..... One tablet by mouth daily for mood    Cymbalta 20 Mg Cpep (Duloxetine hcl) ..... One tablet by mouth daily for mood **to use until pap meds come in**  Orders: UA Dipstick w/o Micro (manual) ([address]) KOH/ WET Mount [phone])  Problem # 3:  HYPERCHOLESTEROLEMIA (ICD-272.0) will recheck labs today Orders: T-Lipid Profile ([id number]) T-Comprehensive  Metabolic Panel ([id number])  Problem # 4:  BREAST MASS, RIGHT (ICD-611.72) will order repeat mammogram today  Complete Medication List: 1)  Retin-a 0.1 % Crea (Tretinoin) .... Apply to face once daily 2)  Oxycodone-acetaminophen 5-325 Mg Tabs (Oxycodone-acetaminophen) .Marland Kitchen.. 1-2 tablet by mouth daily as needed only for extreme pain 3)  Lidocaine 5 % Oint (Lidocaine) .... Apply two times a day to affected area 4)  Tramadol Hcl 50 Mg Tabs (Tramadol hcl) .... One tablet by mouth two times a day for pain  Other Orders: Mammogram (Screening) (Mammo)  Patient Instructions: 1)  Urine and wet prep ok today 2)  Taper off the Cymbalta 3)  Start trazodone 50mg  by mouth two times a day for pain 4)  Follow up in 1 month for med review - trazodone Prescriptions: TRAMADOL HCL 50 MG TABS (TRAMADOL HCL) One tablet by mouth two times a day for pain  #60 x 0   Entered and Authorized by:   Lehman Prom FNP   Signed by:   Lehman Prom FNP on 01/02/2010   Method used:   Print then Give to Patient   RxID:   [id number]   Laboratory Results   Urine Tests  Date/Time Received: January 02, 2010 10:34 AM   Routine Urinalysis   Color: lt. yellow Appearance: Hazy Glucose: negative   (Normal Range: Negative) Bilirubin: negative   (Normal Range: Negative) Ketone: negative   (Normal Range: Negative) Spec. Gravity: 1.010   (Normal Range: 1.003-1.035) Blood: negative   (Normal Range: Negative) pH: 7.5   (Normal Range: 5.0-8.0) Protein: negative   (Normal Range: Negative) Urobilinogen: 0.2   (Normal Range: 0-1) Nitrite: negative   (Normal Range: Negative) Leukocyte Esterace: negative   (Normal Range: Negative)    Date/Time Received: January 02, 2010 11:12 AM   Wet [place]/KOH Source: vaginal WBC/hpf: 1-5 Bacteria/hpf: rare Clue cells/hpf: none Yeast/hpf: none Trichomonas/hpf: none   Laboratory Results   Urine Tests    Routine Urinalysis   Color: lt. yellow Appearance:  Hazy Glucose: negative   (Normal Range: Negative) Bilirubin: negative   (Normal Range: Negative) Ketone: negative   (Normal Range: Negative) Spec. Gravity: 1.010   (Normal Range: 1.003-1.035) Blood: negative   (Normal Range: Negative) pH: 7.5   (Normal Range: 5.0-8.0) Protein: negative   (Normal Range: Negative) Urobilinogen: 0.2   (Normal Range: 0-1) Nitrite: negative   (Normal Range: Negative) Leukocyte Esterace: negative   (Normal Range: Negative)      Wet Mount Wet Mount KOH: Negative

## 2010-12-26 NOTE — Progress Notes (Signed)
Summary: Pain medications  Phone Note Call from Patient Call back at [phone]   Summary of Call: [name] states that her volvadinia  is getting worse so she is wondered if the provider can increase the dose from  tramadol or prescribed her oxycodone.  She started feeling uncomfortable and she set up an office visit for next week (April 20 but if she can get some assistant before then)   Pt will call back again around 4:45 pm [facility]).  [facility] FNP Initial call taken by: Manon Hilding,  March 09, 2010 4:00 PM  Follow-up for Phone Call        pt says she wants to have tramadol since she is at work and want the oxycodone for  home... [facility]... pt wants to know if she can up the dose on tramadol... pt says she has a rx now but wants if she is able to take three tabs daily for the pain and get new rx stating that... pt says she has pills now but if she takes three she will run out...  pt is at work and would like to know if you can call her when you can to let her know if she can start taking three.. work [phone]... Follow-up by: Armenia Shannon,  March 09, 2010 4:53 PM  Additional Follow-up for Phone Call Additional follow up Details #1::        Tried to call pt at work but it was after 5pm Call pt on Friday and let her know... Yes, ok for pt to increase tramadol up to 3 tablets by mouth three times a day (discussed with pt during last visit) Rx sent electronically to [place]  Oxycodone can't be faxed - she has to come pick up the Rx (if she needs it) Additional Follow-up by: Lehman Prom FNP,  March 09, 2010 5:51 PM    Additional Follow-up for Phone Call Additional follow up Details #2::    pt is aware... Armenia Shannon  March 10, 2010 10:36 AM     New/Updated Medications: TRAMADOL HCL 50 MG TABS (TRAMADOL HCL) One tablet by mouth three times a day as needed Prescriptions: TRAMADOL HCL 50 MG TABS (TRAMADOL HCL) One tablet by mouth three times a day  as needed  #90 x 0   Entered and Authorized by:   Lehman Prom FNP   Signed by:   Lehman Prom FNP on 03/09/2010   Method used:   Faxed to ...       [facility] (retail)       [address] [place], [place]  [address]       Ph: [phone]       Fax: [phone]   RxID:   [id number] OXYCODONE-ACETAMINOPHEN 5-325 MG TABS (OXYCODONE-ACETAMINOPHEN) 1-2 tablet by mouth daily as needed ONLY FOR EXTREME PAIN  #30 x 0   Entered by:   Armenia Shannon   Authorized by:   Lehman Prom FNP   Signed by:   Armenia Shannon on 03/09/2010   Method used:   Print then Give to Patient   RxID:   [id number]

## 2010-12-26 NOTE — Letter (Signed)
Summary: Handout Printed  Printed Handout:  - Diet - Low-Cholesterol Guidelines 

## 2010-12-26 NOTE — Progress Notes (Signed)
Summary: RX FOR OXYCODONE  Phone Note Call from Patient Call back at Home Phone [phone]   Reason for Call: Refill Medication Summary of Call: Michele [name] Finnan CALLING IN FOR A RX FOR OXYCODONE, AND SHE CAN COME AND PICK IT UP. Initial call taken by: Leodis Rains,  August 08, 2010 11:52 AM  Follow-up for Phone Call        forward to N. Daphine Deutscher, fnp Follow-up by: Levon Hedger,  August 08, 2010 2:59 PM  Additional Follow-up for Phone Call Additional follow up Details #1::        Rx printed for pt to pick up Additional Follow-up by: Lehman Prom FNP,  August 08, 2010 6:37 PM    Additional Follow-up for Phone Call Additional follow up Details #2::    pt informed and will come to office to pick up Rx it will be in the basket at my desk. Follow-up by: Levon Hedger,  August 09, 2010 9:22 AM  Prescriptions: OXYCODONE-ACETAMINOPHEN 5-325 MG TABS (OXYCODONE-ACETAMINOPHEN) 1-2 tablet by mouth daily as needed ONLY FOR EXTREME PAIN  #30 x 0   Entered and Authorized by:   Lehman Prom FNP   Signed by:   Lehman Prom FNP on 08/08/2010   Method used:   Print then Give to Patient   RxID:   [id number]

## 2010-12-26 NOTE — Progress Notes (Signed)
Summary: Prozac increase  Phone Note Call from Patient   Caller: Patient Summary of Call: pt wants to let dr Delrae Alfred know that she is not having side effects of generic proxac and pt wants to increase the dose to 20 mg and the pharmacy that she use [facility] [phone]. Please, let pt know when the refill is  done @  [phone] (before 2:30 pm ) . evening # [phone]   ext [phone] Thank you . Initial call taken by: Cheryll Dessert,  May 15, 2010 11:22 AM  Follow-up for Phone Call        [facility] will let Dr. Delrae Alfred know. Follow-up by: Vesta Mixer CMA,  May 15, 2010 2:24 PM  Additional Follow-up for Phone Call Additional follow up Details #1::        I think this was meant for [facility] --will forward to her. Additional Follow-up by: Julieanne Manson MD,  May 15, 2010 6:06 PM    Additional Follow-up for Phone Call Additional follow up Details #2::    Fluoxetine increased to 20mg  - new rx sent electronically to [facility] notify pt Follow-up by: Lehman Prom FNP,  May 16, 2010 7:45 AM  New/Updated Medications: FLUOXETINE HCL 20 MG TABS (FLUOXETINE HCL) One tablet by mouth daily for mood **note increase in dose** Prescriptions: FLUOXETINE HCL 20 MG TABS (FLUOXETINE HCL) One tablet by mouth daily for mood **note increase in dose**  #30 x 3   Entered and Authorized by:   Lehman Prom FNP   Signed by:   Lehman Prom FNP on 05/16/2010   Method used:   Electronically to        [facility]  [phone]* (retail)       [address]       [place], [place]  [address]       Ph: [phone] or [phone]       Fax: [phone]   RxID:   [id number]   Appended Document: Prozac increase pt informed.

## 2010-12-26 NOTE — Letter (Signed)
Summary: Lipid Letter  [facility]  [address] [place], [place] [address]   Phone: [phone]  Fax: [phone]    01/04/2010  Susa Bones [address] [place], [place]  [address]  Dear Lafonda Mosses:  We have carefully reviewed your last lipid profile from 01/02/2010 and the results are noted below with a summary of recommendations for lipid management.    Cholesterol:       229     Goal: less than 200   HDL "good" Cholesterol:   75     Goal: greater than 40   LDL "bad" Cholesterol:   138     Goal: less than 130   Triglycerides:       81     Goal: less than 150    You should have been contacted by this office regarding your cholesterol and the need to start medications.  If you have NOT been contacted by the time your receive this letter please call the office.       Current Medications: 1)    Retin-a 0.1 %  Crea (Tretinoin) .... Apply to face once daily 2)    Oxycodone-acetaminophen 5-325 Mg Tabs (Oxycodone-acetaminophen) .Marland Kitchen.. 1-2 tablet by mouth daily as needed only for extreme pain 3)    Lidocaine 5 % Oint (Lidocaine) .... Apply two times a day to affected area 4)    Tramadol Hcl 50 Mg Tabs (Tramadol hcl) .... One tablet by mouth two times a day for pain 5)    Pravastatin Sodium 10 Mg Tabs (Pravastatin sodium) .... One tablet by mouth nightly for cholesterol  If you have any questions, please call. We appreciate being able to work with you.   Sincerely,    [facility] Lehman Prom FNP

## 2010-12-26 NOTE — Assessment & Plan Note (Signed)
Summary: Chronic pain   Vital Signs:  Patient profile:   45 year old female LMP:     08/2010 Weight:      114.0 pounds BMI:     22.91 Temp:     97.8 degrees F oral Pulse rate:   72 / minute Pulse rhythm:   regular BP sitting:   116 / 72  (left arm) Cuff size:   regular  Vitals Entered By: Levon Hedger (September 22, 2010 11:15 AM) CC: follow-up visit 3 month, Lipid Management, Depression Is Patient Diabetic? No Pain Assessment Patient in pain? no       Does patient need assistance? Functional Status Self care Ambulation Normal LMP (date): 08/2010 LMP - Character: heavy     Enter LMP: 08/2010 Last PAP Result Done by GYN - abnormal   CC:  follow-up visit 3 month, Lipid Management, and Depression.  History of Present Illness:  Pt into the office for f/u on chronic issues.  Weight gain - pt has some concerns about her weight. She reports that she has some concerns that her weight is going to cause some psychological issues regarding her weight gain. Pt has questions about starting topamax to help with her depression and also for pain. "I don't want to balloon up"  Social - Pt is unemployed at this time so she is not working.  Reports symptoms have worsened since she has quit working  Depression History:      The patient is having a depressed mood most of the day and has a diminished interest in her usual daily activities.  The patient denies recurrent thoughts of death or suicide.        Psychosocial stress factors include major life changes.  The patient denies that she feels like life is not worth living, denies that she wishes that she were dead, and denies that she has thought about ending her life.         Depression Treatment History:  Prior Medication Used:   Start Date: Assessment of Effect:   Comments:  Zoloft (sertraline)     05/13/2009   discontinued     some weight gain and tired all the time Luvox (fluvoxamine)     --       --       -- Prozac  (fluoxetine)     --     some improvement     will increase to 20 mg po bid  Wellbutrin (bupropion)     04/27/2010     --       not available trazodone (generic)     12/28/2009   much improvement     I am feeling much better Elavil (amitriptyline)     03/26/2009   no improvement       discontinued  Lipid Management History:      Negative NCEP/ATP III risk factors include female age less than 75 years old, no history of early menopause without estrogen hormone replacement, non-diabetic, HDL cholesterol greater than 60, non-tobacco-user status, non-hypertensive, no ASHD (atherosclerotic heart disease), no prior stroke/TIA, no peripheral vascular disease, and no history of aortic aneurysm.        The patient states that she knows about the "Therapeutic Lifestyle Change" diet.      Allergies (verified): 1)  ! Aldomet  Review of Systems General:  Denies fever; Pt has concerns for wieght gain. CV:  Denies chest pain or discomfort. Resp:  Denies cough. GI:  Denies abdominal pain, nausea, and  vomiting. Psych:  Complains of anxiety and depression.  Physical Exam  General:  alert.   Head:  normocephalic.   Eyes:  pupils round.   Lungs:  normal breath sounds.   Heart:  normal rate and regular rhythm.   Neurologic:  alert & oriented X3.   Skin:  color normal.   Psych:  Oriented X3.     Impression & Recommendations:  Problem # 1:  DEPRESSION (ICD-311) Strongly advised pt to keep taking her prozac Her updated medication list for this problem includes:    Fluoxetine Hcl 10 Mg Tabs (Fluoxetine hcl) ..... One tablet by mouth two times a day    Ativan 1 Mg Tabs (Lorazepam) ..... One tablet by mouth nightly for sleep as needed  Problem # 2:  PELVIC  PAIN (ICD-789.09)  Her updated medication list for this problem includes:    Oxycodone-acetaminophen 5-325 Mg Tabs (Oxycodone-acetaminophen) .Marland Kitchen... 1-2 tablet by mouth daily as needed only for extreme pain    Tramadol Hcl 50 Mg Tabs (Tramadol hcl)  ..... One tablet by mouth three times a day as needed  Problem # 3:  ANXIETY (ICD-300.00) will give ativan as needed  Her updated medication list for this problem includes:    Fluoxetine Hcl 10 Mg Tabs (Fluoxetine hcl) ..... One tablet by mouth two times a day    Ativan 1 Mg Tabs (Lorazepam) ..... One tablet by mouth nightly for sleep as needed need to strictly monitor medications Gradually taper meds  Complete Medication List: 1)  Oxycodone-acetaminophen 5-325 Mg Tabs (Oxycodone-acetaminophen) .Marland Kitchen.. 1-2 tablet by mouth daily as needed only for extreme pain 2)  Lidocaine 5 % Oint (Lidocaine) .... Apply two times a day to affected area 3)  Tramadol Hcl 50 Mg Tabs (Tramadol hcl) .... One tablet by mouth three times a day as needed 4)  Pravastatin Sodium 10 Mg Tabs (Pravastatin sodium) .... Hold 5)  Retin-a 0.1 % Crea (Tretinoin) .... Apply to face nightly 6)  Fluoxetine Hcl 10 Mg Tabs (Fluoxetine hcl) .... One tablet by mouth two times a day 7)  Fluconazole 150 Mg Tabs (Fluconazole) .... One tablet by mouth x 3 8)  Ativan 1 Mg Tabs (Lorazepam) .... One tablet by mouth nightly for sleep as needed  Lipid Assessment/Plan:      Based on NCEP/ATP III, the patient's risk factor category is "2 or more risk factors and a calculated 10 year CAD risk of < 20%".  The patient's lipid goals are as follows: Total cholesterol goal is 200; LDL cholesterol goal is 160; HDL cholesterol goal is 40; Triglyceride goal is 150.    Patient Instructions: 1)    2)  You really need to keep taking fluoxetine at 20mg  for your depression.  3)  Especially since the 10mg  by mouth daily was not effective. 4)  Keeping medication for your depression and mood stable is helpful for your overall health 5)  This provider will look at other options for your exercise. 6)  You have been given the flu vaccine today 7)  Follow up in 6 weeks with n.martin,fnp for medication review Prescriptions: ATIVAN 1 MG TABS (LORAZEPAM) One  tablet by mouth nightly for sleep  #20 x 0   Entered and Authorized by:   Lehman Prom FNP   Signed by:   Lehman Prom FNP on 09/22/2010   Method used:   Print then Give to Patient   RxID:   [id number] OXYCODONE-ACETAMINOPHEN 5-325 MG TABS (OXYCODONE-ACETAMINOPHEN) 1-2 tablet by mouth daily as  needed ONLY FOR EXTREME PAIN  #30 x 0   Entered and Authorized by:   Lehman Prom FNP   Signed by:   Lehman Prom FNP on 09/22/2010   Method used:   Print then Give to Patient   RxID:   [id number] ATIVAN 1 MG TABS (LORAZEPAM) One tablet by mouth nightly for sleep  #0 x 0   Entered and Authorized by:   Lehman Prom FNP   Signed by:   Lehman Prom FNP on 09/22/2010   Method used:   Print then Give to Patient   RxID:   [id number] OXYCODONE-ACETAMINOPHEN 5-325 MG TABS (OXYCODONE-ACETAMINOPHEN) 1-2 tablet by mouth daily as needed ONLY FOR EXTREME PAIN  #30 x 0   Entered and Authorized by:   Lehman Prom FNP   Signed by:   Lehman Prom FNP on 09/22/2010   Method used:   Print then Give to Patient   RxID:   [id number] TRAMADOL HCL 50 MG TABS (TRAMADOL HCL) One tablet by mouth three times a day as needed  #90 x 1   Entered and Authorized by:   Lehman Prom FNP   Signed by:   Lehman Prom FNP on 09/22/2010   Method used:   Faxed to ...       [facility] (retail)       [address] [place], [place]  [address]       Ph: [phone]       Fax: [phone]   RxID:   [id number] RETIN-A 0.1 % CREA (TRETINOIN) Apply to face nightly  #45gm x 1   Entered and Authorized by:   Lehman Prom FNP   Signed by:   Lehman Prom FNP on 09/22/2010   Method used:   Faxed to ...       [facility] (retail)       [address] [place], [place]  [address]       Ph: [phone]       Fax: [phone]   RxID:   [id number] FLUOXETINE HCL 10 MG TABS (FLUOXETINE  HCL) One tablet by mouth two times a day  #60 x 5   Entered and Authorized by:   Lehman Prom FNP   Signed by:   Lehman Prom FNP on 09/22/2010   Method used:   Faxed to ...       [facility] (retail)       [address] [place], [place]  [address]       Ph: [phone]       Fax: [phone]   RxID:   [id number]    Orders Added: 1)  Est. Patient Level III [[address]]  Appended Document: Chronic pain     Allergies: 1)  ! Aldomet   Complete Medication List: 1)  Oxycodone-acetaminophen 5-325 Mg Tabs (Oxycodone-acetaminophen) .Marland Kitchen.. 1-2 tablet by mouth daily as needed only for extreme pain 2)  Lidocaine 5 % Oint (Lidocaine) .... Apply two times a day to affected area 3)  Tramadol Hcl 50 Mg Tabs (Tramadol hcl) .... One tablet by mouth three times a day as needed 4)  Pravastatin Sodium 10 Mg Tabs (Pravastatin sodium) .... Hold 5)  Retin-a 0.1 % Crea (Tretinoin) .... Apply to face nightly 6)  Fluoxetine Hcl 10 Mg Tabs (Fluoxetine hcl) .... One tablet by mouth two times a  day 7)  Fluconazole 150 Mg Tabs (Fluconazole) .... One tablet by mouth x 3 8)  Ativan 1 Mg Tabs (Lorazepam) .... One tablet by mouth nightly for sleep as needed  Other Orders: Flu Vaccine 41yrs + ([address]78) Admin 1st Vaccine (29562)   Orders Added: 1)  Flu Vaccine 27yrs + [13086] 2)  Admin 1st Vaccine [57846]   Immunizations Administered:  Influenza Vaccine # 1:    Vaccine Type: Fluvax 3+    Site: right deltoid    Mfr: GlaxoSmithKline    Dose: 0.5 ml    Route: IM    Given by: Levon Hedger    Exp. Date: 05/26/2011    Lot #: [id number]    VIS given: 06/20/10 version given September 25, 2010.  Flu Vaccine Consent Questions:    Do you have a history of severe allergic reactions to this vaccine? no    Any prior history of allergic reactions to egg and/or gelatin? no    Do you have a sensitivity to the preservative Thimersol? no    Do you have a past  history of Guillan-Barre Syndrome? no    Do you currently have an acute febrile illness? no    Have you ever had a severe reaction to latex? no    Vaccine information given and explained to patient? yes    Are you currently pregnant? no   ndc  [phone]  Immunizations Administered:  Influenza Vaccine # 1:    Vaccine Type: Fluvax 3+    Site: right deltoid    Mfr: GlaxoSmithKline    Dose: 0.5 ml    Route: IM    Given by: Levon Hedger    Exp. Date: 05/26/2011    Lot #: [id number]    VIS given: 06/20/10 version given September 25, 2010.

## 2010-12-26 NOTE — Progress Notes (Signed)
Summary: CANT GET [facility]  Phone Note Call from Patient Call back at [facility] Phone [phone]   Summary of Call: MARTIN PT. MS Hoffmeier CALLED TO SAY THAT THE MEDICATION YOU PRESCRIBED THIS MORNING (WELBUTRIN) OUR PHARM CANT GET IT FOR FOR AND SHE WAS TOLD THAT THERE IS NO MEDICAL ASSISTANCE PROGRAM ANY MORE. AND SHE WANTS TO KNOW IF YOU CAN ORDER SOMETHING COMPARABLE TO WHAT WE MAY HAVE.  SHE SAYS THAT IT CANT BE ANYTHING WITH SEROTONIN IN IT,  BECAUSE IT WILL INTERACT WITH THE TRAMADOL.  ONCE YOU FIND OUT WHAT SHE CAN TAKE, SHE WOULD LIKE FOR SOMEONE TO CALL HER.   SHE IS AT WORK NOW, AND THE WK# IS [phone] [id number] Initial call taken by: Leodis Rains,  April 27, 2010 3:57 PM  Follow-up for Phone Call        forward to N. Daphine Deutscher, FNP Follow-up by: Levon Hedger,  April 27, 2010 4:37 PM  Additional Follow-up for Phone Call Additional follow up Details #1::        called pt - will need to review with pharmacist other option for pt to take Additional Follow-up by: Lehman Prom FNP,  April 27, 2010 4:56 PM    Additional Follow-up for Phone Call Additional follow up Details #2::    Unforturnately I did not find an alternative for the pt So if she is wiling - will allow her to get the generic wellbutrin (bupropion) from a local pharmacy since [facility] does not have the medication. What pharmacy does she want me to send the RX? Follow-up by: Lehman Prom FNP,  April 28, 2010 8:03 AM  Additional Follow-up for Phone Call Additional follow up Details #3:: Details for Additional Follow-up Action Taken: Spoke with pt. and advised of provider's response.  She would like bupropion to be sent to [facility] [address] -- to be held only, not filled yet.  Rx for Tramadol was also changed to [facility] per her request. Dutch Quint RN  April 28, 2010 9:55 AM  Rx sent as per pt request n.martin,fnp April 28, 2010  9:59 AM    New/Updated Medications: BUPROPION HCL 150 MG XR12H-TAB (BUPROPION  HCL) One tablet by mouth two times a day for 1 month then increase to 2 tablets by mouth two times a day the following months Prescriptions: TRAMADOL HCL 50 MG TABS (TRAMADOL HCL) One tablet by mouth three times a day as needed  #90 x 1   Entered and Authorized by:   Lehman Prom FNP   Signed by:   Lehman Prom FNP on 04/28/2010   Method used:   Electronically to        [facility]  [phone]* (retail)       [address]       [place], [place]  [address]       Ph: [phone] or [phone]       Fax: [phone]   RxID:   [id number] BUPROPION HCL 150 MG XR12H-TAB (BUPROPION HCL) One tablet by mouth two times a day for 1 month then increase to 2 tablets by mouth two times a day the following months  #1 month qs x 3   Entered and Authorized by:   Lehman Prom FNP   Signed by:   Lehman Prom FNP on 04/28/2010   Method used:   Electronically to        [facility]  #  [address] (retail)       [address]       [place], [place]  [address]       Ph: [phone] or [phone]       Fax: [phone]   RxID:   [id number]

## 2010-12-26 NOTE — Assessment & Plan Note (Signed)
Summary: Vulvodynia   Vital Signs:  Patient profile:   45 year old female LMP:     09/2010 Weight:      112.6 pounds BMI:     22.63 Temp:     97.1 degrees F oral Pulse rate:   90 / minute Pulse rhythm:   regular Resp:     20 per minute BP sitting:   126 / 76  (left arm) Cuff size:   regular  Vitals Entered By: Levon Hedger (November 01, 2010 3:44 PM) CC: med review...vaginal discomfort Is Patient Diabetic? No Pain Assessment Patient in pain? yes     Location: vagina  Does patient need assistance? Functional Status Self care Ambulation Normal LMP (date): 09/2010 LMP - Character: heavy     Enter LMP: 09/2010 Last PAP Result Done by GYN - abnormal   CC:  med review...vaginal discomfort.  History of Present Illness:  Pt into the office - 2 days early for her appt for review of medication. "I've had a bad past 3 days"  Pt has a history of recurrent episodes of vaginal itching and burning. "I can't continue to live like this" It is interferring with my life. Pt is taking medications and presents today with them for review. She has continued to take paxil despite some reluctance due to weight gain. (down 2 pounds since her last visit)  Menses is still monthly. She does have some symptoms of perimenopause but she does still have a period daily  Allergies (verified): 1)  ! Aldomet  Review of Systems General:  Denies loss of appetite. CV:  Denies chest pain or discomfort. Resp:  Denies cough. GI:  Denies abdominal pain, nausea, and vomiting.  Physical Exam  General:  alert.   Head:  normocephalic.   Lungs:  no dullness.   Heart:  normal rate.   Abdomen:  normal bowel sounds.   Neurologic:  alert & oriented X3.   Skin:  color normal.   Psych:  Oriented X3.     Impression & Recommendations:  Problem # 1:  VULVODYNIA UNSPECIFIED (ICD-625.70) discussed dx with pt will review her options  wet prep negative today. will send for culture Orders: UA  Dipstick w/o Micro (automated)  (81003) KOH/ WET Mount [phone])  Problem # 2:  DEPRESSION (ICD-311) pt is taking the fluoxetine  pt is down 2 pounds since her last visit since she has great concerns about weight gain. Her updated medication list for this problem includes    Fluoxetine Hcl 10 Mg Tabs (Fluoxetine hcl) ..... One tablet by mouth two times a day    Ativan 1 Mg Tabs (Lorazepam) ..... One tablet by mouth nightly for sleep as needed  Complete Medication List: 1)  Oxycodone-acetaminophen 5-325 Mg Tabs (Oxycodone-acetaminophen) .Marland Kitchen.. 1-2 tablet by mouth daily as needed only for extreme pain 2)  Lidocaine 5 % Oint (Lidocaine) .... Apply two times a day to affected area 3)  Tramadol Hcl 50 Mg Tabs (Tramadol hcl) .... One tablet by mouth three times a day as needed 4)  Pravastatin Sodium 10 Mg Tabs (Pravastatin sodium) .... Hold 5)  Retin-a 0.1 % Crea (Tretinoin) .... Apply to face nightly 6)  Fluoxetine Hcl 10 Mg Tabs (Fluoxetine hcl) .... One tablet by mouth two times a day 7)  Ativan 1 Mg Tabs (Lorazepam) .... One tablet by mouth nightly for sleep as needed  Other Orders: T-Culture & Smear Fungus ([id number])  Patient Instructions: 1)  Look up to see if there are  any research studies that you can do about vulvodynia. 2)  This provider will research to see if there are additional treatment options for you. 3)  You will receives refill on your medications although you still have some in your bottle but this will keep you from having to get refills in the next week or two. 4)  Follow up in 3 months for vulvodynia Prescriptions: ATIVAN 1 MG TABS (LORAZEPAM) One tablet by mouth nightly for sleep as needed  #20 x 0   Entered and Authorized by:   Lehman Prom FNP   Signed by:   Lehman Prom FNP on 11/01/2010   Method used:   Print then Give to Patient   RxID:   [id number] OXYCODONE-ACETAMINOPHEN 5-325 MG TABS (OXYCODONE-ACETAMINOPHEN) 1-2 tablet by mouth daily as  needed ONLY FOR EXTREME PAIN  #30 x 0   Entered and Authorized by:   Lehman Prom FNP   Signed by:   Lehman Prom FNP on 11/01/2010   Method used:   Print then Give to Patient   RxID:   [id number]    Orders Added: 1)  Est. Patient Level III [29528] 2)  UA Dipstick w/o Micro (automated)  [81003] 3)  KOH/ WET Mount [87210] 4)  T-Culture & Smear Fungus [[id number]]    Laboratory Results   Urine Tests  Date/Time Received: November 01, 2010 4:36 PM   Routine Urinalysis   Color: lt. yellow Glucose: negative   (Normal Range: Negative) Bilirubin: negative   (Normal Range: Negative) Ketone: negative   (Normal Range: Negative) Spec. Gravity: 1.010   (Normal Range: 1.003-1.035) Blood: negative   (Normal Range: Negative) pH: 8.0   (Normal Range: 5.0-8.0) Protein: negative   (Normal Range: Negative) Urobilinogen: 0.2   (Normal Range: 0-1) Nitrite: negative   (Normal Range: Negative) Leukocyte Esterace: negative   (Normal Range: Negative)    Date/Time Received: November 01, 2010 4:37 PM   [employer] Source: vaginal WBC/hpf: 1-5 Bacteria/hpf: rare Clue cells/hpf: none Yeast/hpf: none Wet Mount KOH: Negative Trichomonas/hpf: none

## 2010-12-26 NOTE — Assessment & Plan Note (Signed)
Summary: Chronic Vulvodynia   Vital Signs:  Patient profile:   44 year old female Weight:      109.5 pounds BMI:     22.01 Temp:     98.1 degrees F oral Pulse rate:   100 / minute Pulse rhythm:   regular Resp:     20 per minute BP sitting:   116 / 80  (left arm) Cuff size:   regular  Vitals Entered By: Levon Hedger (June 23, 2010 11:20 AM) CC: 2 month f/u medication review, Depression Is Patient Diabetic? No Pain Assessment Patient in pain? no       Does patient need assistance? Functional Status Self care Ambulation Normal   CC:  2 month f/u medication review and Depression.  History of Present Illness:  Pt into the office for f/u on chronic pain due to vulvydynia. She has chronic pain and depression. Reports now that the heat has lots to do with her pain. She was started on fluoxetine 10mg  by mouth then advisd to increase to 20mg  by mouth however she had headaches. She then decreased back to the Fluoxetine 10mg  which she can tolerate.  Pt is requesting if she can take 10mg  by mouth two times a day   Depression Treatment History:  Prior Medication Used:   Start Date: Assessment of Effect:   Comments:  Zoloft (sertraline)     05/13/2009   discontinued     some weight gain and tired all the time Luvox (fluvoxamine)     --       --       -- Prozac (fluoxetine)     --     some improvement     will increase to 10mg  po bid  Wellbutrin (bupropion)     04/27/2010     --       not available trazodone (generic)     12/28/2009   much improvement     I am feeling much better Elavil (amitriptyline)     03/26/2009   no improvement       discontinued   Allergies (verified): 1)  ! Aldomet  Review of Systems General:  Denies fever. CV:  Denies chest pain or discomfort. Resp:  Denies cough. GI:  Denies abdominal pain, nausea, and vomiting. GU:  chronic pelvic pain.  Physical Exam  General:  alert.   Head:  normocephalic.   Lungs:  normal breath sounds.   Heart:   normal rate and regular rhythm.   Abdomen:  normal bowel sounds.   Msk:  up to the exam table Neurologic:  alert & oriented X3.     Impression & Recommendations:  Problem # 1:  VULVODYNIA UNSPECIFIED (ICD-625.70) pt is doing fairly well - will keep on tramadol  oxycodone as needed   Problem # 2:  DEPRESSION (ICD-311) doing better Her updated medication list for this problem includes:    Fluoxetine Hcl 10 Mg Tabs (Fluoxetine hcl) ..... One tablet by mouth two times a day  Complete Medication List: 1)  Oxycodone-acetaminophen 5-325 Mg Tabs (Oxycodone-acetaminophen) .Marland Kitchen.. 1-2 tablet by mouth daily as needed only for extreme pain 2)  Lidocaine 5 % Oint (Lidocaine) .... Apply two times a day to affected area 3)  Tramadol Hcl 50 Mg Tabs (Tramadol hcl) .... One tablet by mouth three times a day as needed 4)  Pravastatin Sodium 10 Mg Tabs (Pravastatin sodium) .... Hold 5)  Retin-a 0.1 % Crea (Tretinoin) .... Apply to face nightly 6)  Fluoxetine Hcl  10 Mg Tabs (Fluoxetine hcl) .... One tablet by mouth two times a day  Patient Instructions: 1)  Take medications as ordered 2)  Will increase Fluoxetine to 10mg  by mouth two times a day  3)  Hopefully this will improve your mood 4)  Follow up in 3 months for chronic pain. Prescriptions: TRAMADOL HCL 50 MG TABS (TRAMADOL HCL) One tablet by mouth three times a day as needed  #90 x 1   Entered and Authorized by:   Lehman Prom FNP   Signed by:   Lehman Prom FNP on 06/23/2010   Method used:   Print then Give to Patient   RxID:   [id number] FLUOXETINE HCL 10 MG TABS (FLUOXETINE HCL) One tablet by mouth two times a day  #90 x 1   Entered and Authorized by:   Lehman Prom FNP   Signed by:   Lehman Prom FNP on 06/23/2010   Method used:   Print then Give to Patient   RxID:   [id number]

## 2010-12-26 NOTE — Progress Notes (Signed)
Summary: Triage appt  Phone Note Call from Patient   Summary of Call: HAS BURNING WHEN PEEING/VERY PAINFUL/HAS AN APPOINT ON 07/11/10,BUT DOESN'T THINK SHE CAN WAIT THAT LONG/PLEASE GIVE HER A CALL BACK [phone] Initial call taken by: Arta Bruce,  July 06, 2010 10:12 AM  Follow-up for Phone Call        See if patient can come in this afternoon for nurse visit. Follow-up by: Hassell Halim CMA,  July 06, 2010 12:25 PM  Additional Follow-up for Phone Call Additional follow up Details #1::        no she can't come today, but she is coming on friday 08/12 for nurse visit. Additional Follow-up by: Leodis Rains,  July 06, 2010 12:33 PM

## 2010-12-26 NOTE — Letter (Signed)
Summary: [facility]  [facility]   Imported By: Arta Bruce 04/28/2010 09:46:24  _____________________________________________________________________  External Attachment:    Type:   Image     Comment:   External Document

## 2010-12-26 NOTE — Assessment & Plan Note (Signed)
Summary: Chronic pain/Depression   Vital Signs:  Patient profile:   45 year old female Weight:      108.4 pounds BMI:     21.79 BSA:     1.43 Temp:     98.0 degrees F oral Pulse rate:   93 / minute Pulse rhythm:   regular Resp:     20 per minute BP sitting:   129 / 84  (left arm) Cuff size:   regular  Vitals Entered By: Levon Hedger (January 25, 2010 12:03 PM) CC: follow-up visit  medication review, Depression, Lipid Management Is Patient Diabetic? No Pain Assessment Patient in pain? no      Comments tetanus 05/13/2008 pap test 03/25/2009    CC:  follow-up visit  medication review, Depression, and Lipid Management.  History of Present Illness:  Pt into the office for follow up for depression and chronic pain. Pt was tapered off the cymbalta during her last visit.  Cymbalta was making pt very drowsy during daylight hours Tramadol was started.  She is taking two times a day and reports it is doing well.  she sleeps during the night and is up during the day.  She has started to work part time.   Depression History:      The patient denies a depressed mood most of the day and a diminished interest in her usual daily activities.  The patient denies insomnia, fatigue (loss of energy), and recurrent thoughts of death or suicide.        The patient denies that she feels like life is not worth living, denies that she wishes that she were dead, and denies that she has thought about ending her life.        Comments:  I am sleeping when I am supposed to at night. Pain is better overall but she has to take percocet as needed.  Depression Treatment History:  Prior Medication Used:   Start Date: Assessment of Effect:   Comments:  Zoloft (sertraline)     05/13/2009   discontinued     some weight gain and tired all the time Luvox (fluvoxamine)     --       --       -- trazodone (generic)     12/28/2009   much improvement     I am feeling much better Elavil (amitriptyline)      03/26/2009   no improvement       discontinued  Lipid Management History:      Negative NCEP/ATP III risk factors include female age less than 11 years old, no history of early menopause without estrogen hormone replacement, non-diabetic, HDL cholesterol greater than 60, non-tobacco-user status, non-hypertensive, no ASHD (atherosclerotic heart disease), no prior stroke/TIA, no peripheral vascular disease, and no history of aortic aneurysm.        The patient states that she knows about the "Therapeutic Lifestyle Change" diet.  The patient expresses understanding of adjunctive measures for cholesterol lowering.  Comments include: Pt did not start the pravachol as ordered. she would still like to monitor with diet and exercise.      Habits & Providers  Alcohol-Tobacco-Diet     Alcohol drinks/day: 0     Tobacco Status: quit     Tobacco Counseling: to remain off tobacco products  Exercise-Depression-Behavior     Does Patient Exercise: no     Exercise Counseling: to improve exercise regimen     Depression Counseling: further diagnostic testing and/or other treatment  is indicated     Drug Use: never  Comments: the next goal for pt is to improve her exercise regimen  Allergies (verified): No Known Drug Allergies  Review of Systems General:  Denies fatigue and sleep disorder. CV:  Denies chest pain or discomfort. Resp:  Denies cough. GI:  Denies abdominal pain, nausea, and vomiting. GU:  vulvodynia - painful at times.  previous eval by GYN. Takes pain meds, very infrequently during times of extreme flares.Marland Kitchen Psych:  Complains of depression; ongoing but improving.  Physical Exam  General:  alert.   Head:  normocephalic.   Lungs:  normal breath sounds.   Heart:  normal rate and regular rhythm.   Abdomen:  normal bowel sounds.   Msk:  normal ROM.   Neurologic:  alert & oriented X3.   Skin:  color normal.   Psych:  Oriented X3.     Impression & Recommendations:  Problem # 1:   DEPRESSION (ICD-311) improving both pain and depression with tramadol will continue  advised pt to get more physically active  Problem # 2:  PELVIC  PAIN (ICD-789.09) ongoing problem pain meds filled for extreme exacerbations of pain Her updated medication list for this problem includes:    Oxycodone-acetaminophen 5-325 Mg Tabs (Oxycodone-acetaminophen) .Marland Kitchen... 1-2 tablet by mouth daily as needed only for extreme pain    Tramadol Hcl 50 Mg Tabs (Tramadol hcl) ..... One tablet by mouth two times a day for pain  Problem # 3:  HYPERCHOLESTEROLEMIA (ICD-272.0) lab results given to pt she would still like to lower cholesterol with diet handout given Her updated medication list for this problem includes:    Pravastatin Sodium 10 Mg Tabs (Pravastatin sodium) ..... Hold  Problem # 4:  ACNE NEC (ICD-706.1) Meds refilled The following medications were removed from the medication list:    Retin-a 0.1 % Crea (Tretinoin) .Marland Kitchen... Apply to face once daily  Complete Medication List: 1)  Oxycodone-acetaminophen 5-325 Mg Tabs (Oxycodone-acetaminophen) .Marland Kitchen.. 1-2 tablet by mouth daily as needed only for extreme pain 2)  Lidocaine 5 % Oint (Lidocaine) .... Apply two times a day to affected area 3)  Tramadol Hcl 50 Mg Tabs (Tramadol hcl) .... One tablet by mouth two times a day for pain 4)  Pravastatin Sodium 10 Mg Tabs (Pravastatin sodium) .... Hold 5)  Retin-a 0.1 % Crea (Tretinoin) .... Apply to face nightly  Lipid Assessment/Plan:      Based on NCEP/ATP III, the patient's risk factor category is "0-1 risk factors".  The patient's lipid goals are as follows: Total cholesterol goal is 200; LDL cholesterol goal is 160; HDL cholesterol goal is 40; Triglyceride goal is 150.    Patient Instructions: 1)  You can still try with diet and increase exercise for cholesterol. 2)  Will recheck in September 2011. 3)  Follow up in 3 months - June 2011 for medication review Prescriptions: TRAMADOL HCL 50 MG TABS  (TRAMADOL HCL) One tablet by mouth two times a day for pain  #60 x 3   Entered and Authorized by:   Lehman Prom FNP   Signed by:   Lehman Prom FNP on 01/25/2010   Method used:   Printed then faxed to ...       [facility] - Pharmac (retail)       [address] [place], [place]  [address]       Ph: [phone]       Fax: [phone]  RxID:   [id number] RETIN-A 0.1 % CREA (TRETINOIN) Apply to face nightly  #45gm x 1   Entered and Authorized by:   Lehman Prom FNP   Signed by:   Lehman Prom FNP on 01/25/2010   Method used:   Printed then faxed to ...       [facility] - Pharmac (retail)       [address] [place], [place]  [address]       Ph: [phone]       Fax: [phone]   RxID:   [id number] OXYCODONE-ACETAMINOPHEN 5-325 MG TABS (OXYCODONE-ACETAMINOPHEN) 1-2 tablet by mouth daily as needed ONLY FOR EXTREME PAIN  #30 x 0   Entered and Authorized by:   Lehman Prom FNP   Signed by:   Lehman Prom FNP on 01/25/2010   Method used:   Printed then faxed to ...       [facility] - Pharmac (retail)       [address] [place], [place]  [address]       Ph: [phone]       Fax: [phone]   RxID:   [id number] RETIN-A 0.1 % CREA (TRETINOIN) Apply to face nightly  #45gm x 1   Entered and Authorized by:   Lehman Prom FNP   Signed by:   Lehman Prom FNP on 01/25/2010   Method used:   Printed then faxed to ...       [facility] - Pharmac (retail)       [address] [place], [place]  [address]       Ph: [phone]       Fax: [phone]   RxID:   [id number] OXYCODONE-ACETAMINOPHEN 5-325 MG TABS (OXYCODONE-ACETAMINOPHEN) 1-2 tablet by mouth daily as needed ONLY FOR EXTREME PAIN  #30 x 0   Entered and Authorized by:   Lehman Prom FNP   Signed by:   Lehman Prom FNP on  01/25/2010   Method used:   Printed then faxed to ...       [facility] - Pharmac (retail)       [address] [place], [place]  [address]       Ph: [phone]       Fax: [phone]   RxID:   [id number]

## 2010-12-26 NOTE — Assessment & Plan Note (Signed)
Summary: Acute - Bacterial Vaginosis   Vital Signs:  Patient profile:   45 year old female LMP:     02/11/2010 Weight:      116.1 pounds BMI:     23.34 BSA:     1.47 Temp:     98.5 degrees F oral Pulse rate:   75 / minute Pulse rhythm:   regular Resp:     20 per minute BP sitting:   139 / 82  (left arm) Cuff size:   regular  Vitals Entered By: Levon Hedger (March 01, 2010 8:58 AM) CC: vaginal burning, pain and discomfort x 4 days Is Patient Diabetic? No Pain Assessment Patient in pain? yes     Location: vagina  Does patient need assistance? Functional Status Self care Ambulation Normal LMP (date): 02/11/2010 LMP - Character: heavy     Enter LMP: 02/11/2010 Last PAP Result done at [facility]    CC:  vaginal burning and pain and discomfort x 4 days.  History of Present Illness:  Pt into the office with complaint of burning and pain in the vaginal area. Current episode has been ongoing for 4 days ago. Pt has a long history of vulvodynia, burning has been extrememly unbearable for the past 4 days. "I can't function" usually breakthrough of her vulvodynia only lasts 1-2 days but again this has been progressive Tramadol was doing better by way of pain and depression. Dose of 50mg  by mouth two times a day has been doing well for pt and she has a f/u appt already scheduled in June to discuss the increase in the meds. For the past 4 days she has been taking around the clock percocet which is her as needed medication +dysuria +lower abdominal pain (left) - long history of left lower quadrant pain when pt was being seen by Dr. Clearance Coots.  Started 4 days ago  -hematuria  LMP March 19th, which was 36 days after her previous menstrual.  Social - pt has not been able to work this week due to pain.  Medications Prior to Update: 1)  Oxycodone-Acetaminophen 5-325 Mg Tabs (Oxycodone-Acetaminophen) .Marland Kitchen.. 1-2 Tablet By Mouth Daily As Needed Only For Extreme Pain 2)  Lidocaine 5 %  Oint (Lidocaine) .... Apply Two Times A Day To Affected Area 3)  Tramadol Hcl 50 Mg Tabs (Tramadol Hcl) .... One Tablet By Mouth Two Times A Day For Pain 4)  Pravastatin Sodium 10 Mg Tabs (Pravastatin Sodium) .... Hold 5)  Retin-A 0.1 % Crea (Tretinoin) .... Apply To Face Nightly  Review of Systems GI:  Complains of abdominal pain; denies nausea and vomiting; left lower abd. GU:  Complains of dysuria; denies hematuria; vaginal burning.  Physical Exam  General:  alert.   Head:  normocephalic.   Ears:  ear piercing(s) noted.   Genitalia:  self wet prep Psych:  Oriented X3.     Impression & Recommendations:  Problem # 1:  VAGINITIS, BACTERIAL (ICD-616.10) Dx reviewed with pt will give flagyl  will also give diflucan as pt usually gets yeast infections from any antibiotics Orders: KOH/ WET Mount [id number]) UA Dipstick w/o Micro (manual) (01093)  Her updated medication list for this problem includes:    Metronidazole 500 Mg Tabs (Metronidazole) ..... One tablet by mouth two times a day for infection  Problem # 2:  VULVODYNIA UNSPECIFIED (ICD-625.70) chronic problem  Complete Medication List: 1)  Oxycodone-acetaminophen 5-325 Mg Tabs (Oxycodone-acetaminophen) .Marland Kitchen.. 1-2 tablet by mouth daily as needed only for extreme pain 2)  Lidocaine 5 % Oint (Lidocaine) .... Apply two times a day to affected area 3)  Tramadol Hcl 50 Mg Tabs (Tramadol hcl) .... One tablet by mouth two times a day for pain 4)  Pravastatin Sodium 10 Mg Tabs (Pravastatin sodium) .... Hold 5)  Retin-a 0.1 % Crea (Tretinoin) .... Apply to face nightly 6)  Metronidazole 500 Mg Tabs (Metronidazole) .... One tablet by mouth two times a day for infection 7)  Fluconazole 200 Mg Tabs (Fluconazole) .... One tablet daily for 3 days  Patient Instructions: 1)  Bacterial vaginosis - One tablet by mouth two times a day for infection. 2)  Take fluconazole 200mg  by mouth daily x 3 days 3)  Keep appointment in June as  scheduled. Prescriptions: FLUCONAZOLE 200 MG TABS (FLUCONAZOLE) One tablet daily for 3 days  #3 x 1   Entered and Authorized by:   Lehman Prom FNP   Signed by:   Lehman Prom FNP on 03/01/2010   Method used:   Print then Give to Patient   RxID:   [id number] METRONIDAZOLE 500 MG TABS (METRONIDAZOLE) One tablet by mouth two times a day for infection  #14 x 0   Entered and Authorized by:   Lehman Prom FNP   Signed by:   Lehman Prom FNP on 03/01/2010   Method used:   Print then Give to Patient   RxID:   [id number]   Laboratory Results   Urine Tests  Date/Time Received: March 01, 2010 9:36 AM   Routine Urinalysis   Color: lt. yellow Glucose: negative   (Normal Range: Negative) Bilirubin: negative   (Normal Range: Negative) Ketone: negative   (Normal Range: Negative) Spec. Gravity: 1.015   (Normal Range: 1.003-1.035) Blood: negative   (Normal Range: Negative) pH: 7.0   (Normal Range: 5.0-8.0) Protein: negative   (Normal Range: Negative) Urobilinogen: 0.2   (Normal Range: 0-1) Nitrite: negative   (Normal Range: Negative) Leukocyte Esterace: negative   (Normal Range: Negative)    Date/Time Received: March 01, 2010 9:37 AM   [employer] Mount Source: vaginal WBC/hpf: 1-5 Bacteria/hpf: rare Clue cells/hpf: moderate Yeast/hpf: none Wet Mount KOH: Negative Trichomonas/hpf: none

## 2010-12-26 NOTE — Progress Notes (Signed)
Summary: WELBUTRIN IS TOO EXPENSIVE  Phone Note Call from Patient   Reason for Call: Talk to Doctor Summary of Call: Michele [name] SAYS THAT THE GENERIC IS TOO EXPENSIVE FOR HER BUDGET, SO SHE WANTS TO KNOW IF SHE CAN GET THE GENERIC PROZAC, SMALL DOSAGE, BUT SHE MAY HAVE TO BE MONITORED WITH THAT AND WHILE SHE IS TAKING THE TRAMADOL AS WELL.  SHE IS AT WORK UNTIL LATE, SO YOU CAN CALL HER AT [phone] Initial call taken by: Leodis Rains,  April 28, 2010 4:00 PM  Follow-up for Phone Call        forward to N. Daphine Deutscher, FNP Follow-up by: Levon Hedger,  April 28, 2010 4:11 PM  Additional Follow-up for Phone Call Additional follow up Details #1::        Pt advised me of the cost of the generic - stating she had priced it previously....that is why i sent it to the pharmacy per her request. I have no problem if she wants to try prozac at a low dose - 10mg  with the potential to increase if tolerating well.  - Rx in basket - fax once pt is sure this is what she wants Additional Follow-up by: Lehman Prom FNP,  April 28, 2010 4:29 PM    Additional Follow-up for Phone Call Additional follow up Details #2::    Levon Hedger  April 28, 2010 4:48 PM Pt informed.  Rx [facility]ed [facility]  New/Updated Medications: FLUOXETINE HCL 10 MG CAPS (FLUOXETINE HCL) One capsule by mouth daily for mood Prescriptions: FLUOXETINE HCL 10 MG CAPS (FLUOXETINE HCL) One capsule by mouth daily for mood  #30 x 1   Entered and Authorized by:   Lehman Prom FNP   Signed by:   Lehman Prom FNP on 04/28/2010   Method used:   Printed then faxed to ...       [facility]  [address]  [phone]* (retail)       [address]       [place], [place]  [address]       Ph: [phone] or [phone]       Fax: [phone]   RxID:   [id number]

## 2010-12-26 NOTE — Progress Notes (Signed)
Summary: tramadol refll  Phone Note Call from Patient   Summary of Call: Pt requesting tramadol refill be sent to [facility].  She states she is supposed to get #90 and needs 2 refills until her f/u with you in June. Initial call taken by: Vesta Mixer CMA,  March 14, 2010 11:02 AM  Follow-up for Phone Call        [place] - i thought i took care of this last week. Rx printed on 03-09-2010 (where was it sent? - i think [facility] b/c they were asking if the new Rx would void out the existing Rx) I did not put refills on it but pt can just call next month and request refills. she can call [facility] to see if meds are ready - if so she will need to get from there OR she can have THEM  [facility] Follow-up by: Lehman Prom FNP,  March 14, 2010 11:09 AM  Additional Follow-up for Phone Call Additional follow up Details #1::        Left message with someone for pt to call back.Marland KitchenMarland KitchenArmenia Shannon  March 14, 2010 11:50 AM   pt is aware and will call us for refill Additional Follow-up by: [place] Shannon,  March 14, 2010 2:15 PM

## 2010-12-26 NOTE — Progress Notes (Signed)
Summary: CALLING IN FOR HER TRAMADOL  Phone Note Call from Patient Call back at Home Phone [phone]   Reason for Call: Refill Medication Summary of Call: MARTIN PT. MS Denny CALLED TO LET YOU KNOW THAT SHE NEEDS TRAMADOL 50 MG #90 3 A DAY, AND THAT SHE NEEDS 1 RX AND 1 REFILL BECAUSE SHE IS COMING BACK IN [place].  SHE USES [facility] [address] THE NUMBER IS [phone] Initial call taken by: Leodis Rains,  Apr 03, 2010 11:27 AM  Follow-up for Phone Call        forward to N. Daphine Deutscher, fnp Follow-up by: Levon Hedger,  Apr 03, 2010 12:57 PM  Additional Follow-up for Phone Call Additional follow up Details #1::        notifypt that she received 90 tablets on 03/09/2010  will refill another 90 tablets on 04/06/2010 (early) since i will be out of the office on 04/07/2010 Additional Follow-up by: Lehman Prom FNP,  Apr 03, 2010 12:59 PM    Additional Follow-up for Phone Call Additional follow up Details #2::    pt informed and uses [facility]  the phone number is [phone]. Follow-up by: Levon Hedger,  Apr 04, 2010 4:30 PM

## 2010-12-28 NOTE — Progress Notes (Signed)
Summary: Culture results  Phone Note Outgoing Call   Summary of Call: notify pt that culture finally returned to and it was Negative. NO fungus noted Pt was advised of this during recent visit but I wanted to send off the culture just to be sure Initial call taken by: Lehman Prom FNP,  November 30, 2010 7:35 PM  Follow-up for Phone Call        pt informed of above information Follow-up by: Levon Hedger,  December 01, 2010 9:34 AM

## 2011-01-11 ENCOUNTER — Telehealth (INDEPENDENT_AMBULATORY_CARE_PROVIDER_SITE_OTHER): Payer: Self-pay | Admitting: Nurse Practitioner

## 2011-01-23 NOTE — Progress Notes (Signed)
Summary: Wants to switch from Prozac to Wellbutrin  Phone Note Call from Patient   Summary of Call: pt called stating that she would like to switch her med prozac to wellbutrin.... [facility] and she would like to switch.... [facility].... pt would like a call back Initial call taken by: [place] Shannon,  January 11, 2011 12:24 PM  Follow-up for Phone Call        Left message with female for pt. to return call.  Dutch Quint RN  January 11, 2011 12:33 PM  Has gained additional five pounds and is concerned that medication is either making her crave sweets or just gain weight, also made her cry a lot, felt better when she stopped. Stopped Prozac weeks ago, hasn't been taking.  Please send new Rx to [facility].  She already had a Rx at [facility] for Wellbutrin, but it was too expensive there.  Follow-up by: Dutch Quint RN,  January 11, 2011 3:44 PM  Additional Follow-up for Phone Call Additional follow up Details #1::        I have reviewed with pt time and time again about these medications. ALL of them have similar side effects. It was not advised that she STOP suddenly taking the prozac but since she has already done it then too late she has been on wellbutrin in the past and think it was stopped because [facility] no longer carried it. I had not been advised that they have it but since she has already check I will send rx over and she can check with the pharmacy for availablity Additional Follow-up by: Lehman Prom FNP,  January 11, 2011 5:01 PM    Additional Follow-up for Phone Call Additional follow up Details #2::    Left message on answering machine for pt. to return call.  Dutch Quint RN  January 12, 2011 3:42 PM  Left message with female for pt. to return call.  Dutch Quint RN  January 15, 2011 2:27 PM  Pt. called and spoke to Arna Medici -- she picked up med.  Dutch Quint RN  January 15, 2011 4:27 PM   New/Updated Medications: BUPROPION HCL 150  MG XR12H-TAB (BUPROPION HCL) One tablet by mouth two times a day for mood Prescriptions: BUPROPION HCL 150 MG XR12H-TAB (BUPROPION HCL) One tablet by mouth two times a day for mood  #60 x 5   Entered and Authorized by:   Lehman Prom FNP   Signed by:   Lehman Prom FNP on 01/11/2011   Method used:   Faxed to ...       [facility] (retail)       [address] [place], [place]  [address]       Ph: [phone]       Fax: [phone]   RxID:   [id number]

## 2011-02-01 ENCOUNTER — Telehealth (INDEPENDENT_AMBULATORY_CARE_PROVIDER_SITE_OTHER): Payer: Self-pay | Admitting: Nurse Practitioner

## 2011-02-06 NOTE — Progress Notes (Signed)
Summary: uRGENT-??NORO-VIRUS  Phone Note Call from Patient Call back at [facility] Phone [phone]   Summary of Call: DAU HAS NORO-VIRUS, SEVERE, PAT THINKS SYMPTOMS COMING ON FOR HER TOO, PLEASE CALL -URGENT Initial call taken by: Ernestine Mcmurray,  February 01, 2011 8:43 AM  Follow-up for Phone Call        Has had several episodes of diarrhea, however feeling better today. No fever has been using Immodium with some relief. Recommended cl liquids for 24 hrs. then advance to BRAT. If unable to tolerate fluids has constant diarrhea or blood in stool, needs to go to ER. Follow-up by: Gaylyn Cheers RN,  February 02, 2011 4:12 PM

## 2011-02-12 ENCOUNTER — Encounter (INDEPENDENT_AMBULATORY_CARE_PROVIDER_SITE_OTHER): Payer: Self-pay | Admitting: Nurse Practitioner

## 2011-02-12 ENCOUNTER — Encounter: Payer: Self-pay | Admitting: Nurse Practitioner

## 2011-02-12 LAB — CONVERTED CEMR LAB
Bilirubin Urine: NEGATIVE
Blood in Urine, dipstick: NEGATIVE
Ketones, urine, test strip: NEGATIVE
Specific Gravity, Urine: <1.005
Urobilinogen, UA: 0.2
pH: 6.5

## 2011-02-12 LAB — POCT URINALYSIS DIP (DEVICE)
Bilirubin Urine: NEGATIVE
Glucose, UA: NEGATIVE mg/dL
Hgb urine dipstick: NEGATIVE
Ketones, ur: NEGATIVE mg/dL
Nitrite: NEGATIVE
Specific Gravity, Urine: <=1.005 (ref 1.005–1.030)

## 2011-02-13 LAB — POCT URINALYSIS DIP (DEVICE)
Bilirubin Urine: NEGATIVE
Glucose, UA: NEGATIVE mg/dL
Hgb urine dipstick: NEGATIVE
Ketones, ur: NEGATIVE mg/dL
Nitrite: NEGATIVE

## 2011-02-22 NOTE — Progress Notes (Signed)
Summary: Office VisitDEPRESSION SCREENING  Office VisitDEPRESSION SCREENING   Imported By: Arta Bruce 02/15/2011 14:24:30  _____________________________________________________________________  External Attachment:    Type:   Image     Comment:   External Document

## 2011-02-22 NOTE — Assessment & Plan Note (Signed)
Summary: Chronic Vulvydynia   Vital Signs:  Patient profile:   45 year old female Menstrual status:  regular LMP:     02/05/2011 Weight:      111.38 pounds Temp:     98.1 degrees F oral Pulse rate:   99 / minute Pulse rhythm:   regular Resp:     16 per minute BP sitting:   115 / 74  (left arm) Cuff size:   regular  Vitals Entered By: Hale Drone CMA (February 12, 2011 12:51 PM) CC: 3 month f/u for vulvodynia.... c/o of vaginal discomfort and itching for x2 days.... Has stopped welburtin., Depression, Lipid Management Is Patient Diabetic? No Pain Assessment Patient in pain? no       Does patient need assistance? Functional Status Self care Ambulation Normal LMP (date): 02/05/2011 LMP - Character: heavy     Menstrual Status regular Enter LMP: 02/05/2011 Last PAP Result Done by GYN - abnormal   CC:  3 month f/u for vulvodynia.... c/o of vaginal discomfort and itching for x2 days.... Has stopped welburtin., Depression, and Lipid Management.  History of Present Illness:  Pt into  the office for routine f/u - chronic vulvudynia  Depression Treatment History:  Prior Medication Used:   Start Date: Assessment of Effect:   Comments:  Zoloft (sertraline)     05/13/2009   discontinued     some weight gain and tired all the time Luvox (fluvoxamine)     --       --       -- Prozac (fluoxetine)     --     some improvement     pt stopped due to side effects Wellbutrin (bupropion)     04/27/2010     --       pt reports increase is symptoms Effexor (venlafaxine)     02/12/2011     --       started trazodone (generic)     12/28/2009   much improvement     I am feeling much better Elavil (amitriptyline)     03/26/2009   no improvement       discontinued  Lipid Management History:      Negative NCEP/ATP III risk factors include female age less than 70 years old, no history of early menopause without estrogen hormone replacement, non-diabetic, HDL cholesterol greater than 60,  non-tobacco-user status, non-hypertensive, no ASHD (atherosclerotic heart disease), no prior stroke/TIA, no peripheral vascular disease, and no history of aortic aneurysm.        The patient states that she knows about the "Therapeutic Lifestyle Change" diet.  Comments include: pt is not fasting today so she is not able to get labs.      Habits & Providers  Alcohol-Tobacco-Diet     Alcohol drinks/day: 0     Tobacco Status: quit     Tobacco Counseling: to remain off tobacco products  Exercise-Depression-Behavior     Does Patient Exercise: no     Exercise Counseling: to improve exercise regimen     Have you felt down or hopeless? yes     Have you felt little pleasure in things? yes     Depression Counseling: further diagnostic testing and/or other treatment is indicated     Drug Use: never  Current Medications (verified): 1)  Oxycodone-Acetaminophen 5-325 Mg Tabs (Oxycodone-Acetaminophen) .Marland Kitchen.. 1-2 Tablet By Mouth Daily As Needed Only For Extreme Pain 2)  Lidocaine 5 % Oint (Lidocaine) .... Apply Two Times A Day To Affected  Area 3)  Tramadol Hcl 50 Mg Tabs (Tramadol Hcl) .... One Tablet By Mouth Three Times A Day As Needed 4)  Pravastatin Sodium 10 Mg Tabs (Pravastatin Sodium) .... Hold 5)  Retin-A 0.1 % Crea (Tretinoin) .... Apply To Face Nightly 6)  Ativan 1 Mg Tabs (Lorazepam) .... One Tablet By Mouth Nightly For Sleep As Needed  Allergies (verified): 1)  ! Aldomet  Review of Systems CV:  Denies chest pain or discomfort. Resp:  Denies cough. GI:  Denies abdominal pain. GU:  Denies dysuria; +burning and irritation.  Physical Exam  General:  alert.   Head:  normocephalic.   Lungs:  normal breath sounds.   Heart:  normal rate and regular rhythm.   Abdomen:  normal bowel sounds.   Msk:  up to the exam exam Neurologic:  alert & oriented X3.     Impression & Recommendations:  Problem # 1:  VULVODYNIA UNSPECIFIED (ICD-625.70)  ongoing issue for pt  Orders: UA Dipstick  w/o Micro (automated)  (81003)  Problem # 2:  DEPRESSION (ICD-311) ongoing battle with pt will refer to mental health provider here The following medications were removed from the medication list:    Bupropion Hcl 150 Mg Xr12h-tab (Bupropion hcl) ..... One tablet by mouth two times a day for mood Her updated medication list for this problem includes:    Ativan 1 Mg Tabs (Lorazepam) ..... One tablet by mouth nightly for sleep as needed    Effexor Xr 37.5 Mg Xr24h-cap (Venlafaxine hcl) ..... One tablet by mouth daily for 1 week then 2 capsules daily for mood  Complete Medication List: 1)  Oxycodone-acetaminophen 5-325 Mg Tabs (Oxycodone-acetaminophen) .Marland Kitchen.. 1-2 tablet by mouth daily as needed only for extreme pain 2)  Lidocaine 5 % Oint (Lidocaine) .... Apply two times a day to affected area 3)  Tramadol Hcl 50 Mg Tabs (Tramadol hcl) .... One tablet by mouth three times a day as needed 4)  Pravastatin Sodium 10 Mg Tabs (Pravastatin sodium) .... Hold 5)  Retin-a 0.1 % Crea (Tretinoin) .... Apply to face nightly 6)  Ativan 1 Mg Tabs (Lorazepam) .... One tablet by mouth nightly for sleep as needed 7)  Effexor Xr 37.5 Mg Xr24h-cap (Venlafaxine hcl) .... One tablet by mouth daily for 1 week then 2 capsules daily for mood  Lipid Assessment/Plan:      Based on NCEP/ATP III, the patient's risk factor category is "2 or more risk factors and a calculated 10 year CAD risk of < 20%".  The patient's lipid goals are as follows: Total cholesterol goal is 200; LDL cholesterol goal is 160; HDL cholesterol goal is 40; Triglyceride goal is 150.    Patient Instructions: 1)  Schedule an appointment for a fasting lab visit - these labs are done once per year.  No food after midnight before this visit. 2)  lipids, cmet, cbc, rapid hiv, rpr, tsh 3)  You will be started on effexor 37.5 by mouth daily.  increase to 2 tablets in one week. 4)  You will be called by the intergrated mental health care providers today.   This is a very good program that is established here and this [facility] and is would be good for you to participate 5)  Get your PAP from GYN 6)  Follow up in 3 months for vulvadynia Prescriptions: ATIVAN 1 MG TABS (LORAZEPAM) One tablet by mouth nightly for sleep as needed  #20 x 0   Entered and Authorized by:   Lehman Prom  FNP   Signed by:   Lehman Prom FNP on 02/12/2011   Method used:   Print then Give to Patient   RxID:   [id number] OXYCODONE-ACETAMINOPHEN 5-325 MG TABS (OXYCODONE-ACETAMINOPHEN) 1-2 tablet by mouth daily as needed ONLY FOR EXTREME PAIN  #30 x 0   Entered and Authorized by:   Lehman Prom FNP   Signed by:   Lehman Prom FNP on 02/12/2011   Method used:   Print then Give to Patient   RxID:   [id number] EFFEXOR XR 37.5 MG XR24H-CAP (VENLAFAXINE HCL) One tablet by mouth daily for 1 week then 2 capsules daily for mood  #60 x 5   Entered and Authorized by:   Lehman Prom FNP   Signed by:   Lehman Prom FNP on 02/12/2011   Method used:   Faxed to ...       [facility]  [address]  [phone]* (retail)       [address]       [place], [place]  [address]       Ph: [phone] or [phone]       Fax: [phone]   RxID:   [id number]    Orders Added: 1)  UA Dipstick w/o Micro (automated)  [81003] 2)  Est. Patient Level III [60630]    Laboratory Results   Urine Tests  Date/Time Received: February 12, 2011 1:04 PM   Routine Urinalysis   Color: lt. yellow Glucose: negative   (Normal Range: Negative) Bilirubin: negative   (Normal Range: Negative) Ketone: negative   (Normal Range: Negative) Spec. Gravity: <1.005   (Normal Range: 1.003-1.035) Blood: negative   (Normal Range: Negative) pH: 6.5   (Normal Range: 5.0-8.0) Protein: negative   (Normal Range: Negative) Urobilinogen: 0.2   (Normal Range: 0-1) Nitrite: negative   (Normal Range: Negative) Leukocyte Esterace: negative   (Normal Range:  Negative)      Wet Mount Source: vaginal WBC/hpf: 1-5 Bacteria/hpf: rare Clue cells/hpf: none Yeast/hpf: none Wet Mount KOH: Negative Trichomonas/hpf: none

## 2011-03-06 LAB — POCT URINALYSIS DIP (DEVICE)
Bilirubin Urine: NEGATIVE
Glucose, UA: NEGATIVE mg/dL
Nitrite: NEGATIVE

## 2011-03-07 LAB — POCT URINALYSIS DIP (DEVICE)
Bilirubin Urine: NEGATIVE
Glucose, UA: NEGATIVE mg/dL
Nitrite: NEGATIVE
Urobilinogen, UA: 0.2 mg/dL (ref 0.0–1.0)

## 2011-03-07 LAB — POCT PREGNANCY, URINE: Preg Test, Ur: NEGATIVE

## 2011-03-07 LAB — URINALYSIS, ROUTINE W REFLEX MICROSCOPIC
Bilirubin Urine: NEGATIVE
Bilirubin Urine: NEGATIVE
Hgb urine dipstick: NEGATIVE
Hgb urine dipstick: NEGATIVE
Ketones, ur: NEGATIVE mg/dL
Nitrite: NEGATIVE
Nitrite: NEGATIVE
Specific Gravity, Urine: 1.006 (ref 1.005–1.030)
Specific Gravity, Urine: <1.005 — ABNORMAL LOW (ref 1.005–1.030)
Urobilinogen, UA: 0.2 mg/dL (ref 0.0–1.0)
pH: 6 (ref 5.0–8.0)
pH: 6 (ref 5.0–8.0)

## 2011-03-07 LAB — WET PREP, GENITAL
Clue Cells Wet Prep HPF POC: NONE SEEN
Trich, Wet Prep: NONE SEEN
Trich, Wet Prep: NONE SEEN
WBC, Wet Prep HPF POC: NONE SEEN
Yeast Wet Prep HPF POC: NONE SEEN

## 2011-03-07 LAB — GC/CHLAMYDIA PROBE AMP, GENITAL
Chlamydia, DNA Probe: NEGATIVE
GC Probe Amp, Genital: NEGATIVE

## 2011-03-08 LAB — URINALYSIS, ROUTINE W REFLEX MICROSCOPIC
Glucose, UA: NEGATIVE mg/dL
Hgb urine dipstick: NEGATIVE
Hgb urine dipstick: NEGATIVE
Ketones, ur: NEGATIVE mg/dL
Nitrite: NEGATIVE
Protein, ur: NEGATIVE mg/dL
Protein, ur: NEGATIVE mg/dL
Protein, ur: NEGATIVE mg/dL
Specific Gravity, Urine: 1.006 (ref 1.005–1.030)
Urobilinogen, UA: 0.2 mg/dL (ref 0.0–1.0)
Urobilinogen, UA: 0.2 mg/dL (ref 0.0–1.0)
pH: 5.5 (ref 5.0–8.0)
pH: 7 (ref 5.0–8.0)

## 2011-03-08 LAB — POCT URINALYSIS DIP (DEVICE)
Bilirubin Urine: NEGATIVE
Glucose, UA: NEGATIVE mg/dL
Nitrite: NEGATIVE
Urobilinogen, UA: 0.2 mg/dL (ref 0.0–1.0)

## 2011-03-08 LAB — RPR: RPR Ser Ql: NONREACTIVE

## 2011-03-08 LAB — POCT PREGNANCY, URINE
Preg Test, Ur: NEGATIVE
Preg Test, Ur: NEGATIVE

## 2011-03-08 LAB — WET PREP, GENITAL
WBC, Wet Prep HPF POC: NONE SEEN
Yeast Wet Prep HPF POC: NONE SEEN

## 2011-03-12 LAB — POCT PREGNANCY, URINE: Preg Test, Ur: NEGATIVE

## 2011-03-12 LAB — DIFFERENTIAL
Basophils Relative: 0 % (ref 0–1)
Eosinophils Absolute: 0 10*3/uL (ref 0.0–0.7)
Eosinophils Relative: 1 % (ref 0–5)
Lymphs Abs: 1.3 10*3/uL (ref 0.7–4.0)
Monocytes Absolute: 0.6 10*3/uL (ref 0.1–1.0)
Monocytes Relative: 12 % (ref 3–12)

## 2011-03-12 LAB — CBC
HCT: 30.9 % — ABNORMAL LOW (ref 36.0–46.0)
Hemoglobin: 10.6 g/dL — ABNORMAL LOW (ref 12.0–15.0)
MCHC: 34.2 g/dL (ref 30.0–36.0)
MCV: 97.3 fL (ref 78.0–100.0)
RBC: 3.18 MIL/uL — ABNORMAL LOW (ref 3.87–5.11)
WBC: 4.9 10*3/uL (ref 4.0–10.5)

## 2011-03-12 LAB — BASIC METABOLIC PANEL
CO2: 30 mEq/L (ref 19–32)
Chloride: 103 mEq/L (ref 96–112)
Creatinine, Ser: 0.64 mg/dL (ref 0.4–1.2)
GFR calc Af Amer: >60 mL/min (ref >60)
Potassium: 3.8 mEq/L (ref 3.5–5.1)
Sodium: 137 mEq/L (ref 135–145)

## 2011-04-10 NOTE — Group Therapy Note (Signed)
Michele, [name] NO.:  [id number]   MEDICAL RECORD NO.:  [id number]          PATIENT TYPE:  WOC   LOCATION:  [facility]                   FACILITY:  [facility]   PHYSICIAN:  Argentina Donovan, MD        DATE OF BIRTH:  1965/12/06   DATE OF SERVICE:  06/01/2009                                  CLINIC NOTE   PROGRESS NOTE:  The patient is a 45 year old Caucasian female with  vulvodynia especially located in the clitoral area that has been seen by  many doctors as well as in our clinic for some time. Over a period of  time she had lost 100 pounds and is deathly fear of gaining weight  again, although she only weighs 104 pounds at 5 foot and she has a  normal blood pressure. We have told her that we are no longer going to  give her narcotics for treatment of chronic pain.  I had previously  referred her to the pain clinic. We talked to her about amitriptyline.  She was resistant to take it because she was worried about weight gain.  When she tried on it at 25 mg it caused an enormous appetite increase,  which caused her to panic from that. She is on Zoloft and likes it for  depression.  I have talked to her about the combination and the  possibility of serotonin syndrome.  She has looked up serotonin  syndrome, understands it and will be aware of any of the symptoms that  may be approaching that type of side effect and we have convinced her to  at least try the 12.5 mg of amitriptyline. She is going to start doing  that. In addition she told me today that most of the pain that she has  goes away completely for a short 7 days a month when she is having a  period. I cannot explain this except for the possibility of reduction in  congestion of the area so we are going to give her to follow, a trial of  hydrochlorothiazide to see if reducing using a diuretic gives her any  help. In addition to this, she will be taking amitriptyline. I gave her  of small prescription of 40  Hydrocodone 325 for rescue only and I told  her we are not going to renew that. The lidocaine, although she had some  relief occasionally, it burns and that adds to her anxiety and the pain.  She obviously has a significant hypovolemic component to this and I have  referred her to Dr. Almeta Monas at the pain clinic and hopefully they can help  her with the Tens units, acupuncture or something to clear this up. I  can think of right now, no other solution to this problem and I am  hoping that we will give her some relief.   IMPRESSION:  1. Vulvodynia. May have an etiology and HPV, for which she was treated      many years ago.  2. Depression.           ______________________________  Argentina Donovan, MD  PR/MEDQ  D:  06/01/2009  T:  06/01/2009  Job:  [id number]

## 2011-04-10 NOTE — Group Therapy Note (Signed)
NAMEGLYN, Michele [name]                 ACCOUNT NO.:  [id number]   MEDICAL RECORD NO.:  [id number]          PATIENT TYPE:  WOC   LOCATION:  [facility]                   FACILITY:  [facility]   PHYSICIAN:  Sid Falcon, CNM  DATE OF BIRTH:  19-Apr-1966   DATE OF SERVICE:  03/25/2009                                  CLINIC NOTE   The patient is here for well-woman exam and followup after being seen in  urgent care on March 21, 2009 for vaginitis.  At that visit the  patient's wet prep and GC/Ct were completed.  Results negative.  The  patient is here reporting continued vaginal burning.  Denies recent  __________ etc.  The patient reports that the burning is intense, that  it prevents sleeping at night, and feels like it is debilitating.  She  does state that Terazol cream did help in the past.  Last menstrual  period on March 03, 2009 and last mammogram on February 24, 2009.   EXAM:  The patient is extremely tearful when discussing the vaginitis.  VITAL SIGNS:  Stable.  98.7, pulse 88, blood pressure 132/88.  NECK:  No thyromegaly.  BREASTS:  Exam not done.  Per patient had a complete exam for mammogram.  PELVIC EXAM:  Vagina:  No abnormal lesions, normal discharge seen.  No  redness seen.  Cervix:  No abnormal discharge.  No lesions.  Negative  redness.  A small amount of white discharge seen in the posterior  vaginal canal and vault.  Negative cervical motion tenderness.  Uterus:  Normal.  Nontender with palpation, without masses.  Adnexa:  Nontender  with palpation.  No dominant masses.   ASSESSMENT:  1. Well-woman exam.  2. Vaginitis.   PLAN:  Culture of vaginal discharge sent to lab for identification of  potential cause of vaginitis.  Pap smear and wet prep sent.   PRESCRIPTIONS:  Lidocaine 2% gel apply h.s. with a cotton ball to the  perineum and introitus.  Zyrtec 10 mg q.h.s. and Terazol 7 cream apply 1  applicator every night x7 nights.   Due to the extreme reaction of the  patient of the vaginitis, the social  worker was called to come evaluate the patient and will come to the unit  for this evaluation.  The patient is to follow up if this does not  resolve.      Sid Falcon, CNM     WM/MEDQ  D:  03/25/2009  T:  03/25/2009  Job:  [id number]

## 2011-04-10 NOTE — Group Therapy Note (Signed)
NAMECHASTY, RANDAL NO.:  [id number]   MEDICAL RECORD NO.:  [id number]          PATIENT TYPE:  WOC   LOCATION:  [facility]                   FACILITY:  [facility]   PHYSICIAN:  Argentina Donovan, MD        DATE OF BIRTH:  1965/12/25   DATE OF SERVICE:  04/14/2009                                  CLINIC NOTE   The patient is a 45 year old, Caucasian female who has been treated by  many doctors over a significant period of time for what she said was  recurrent vaginal infections, bacterial vaginosis, yeast infections  treated with anti-yeast medications and Flagyl many times, antibiotics  at times.  She is especially having pain in the clitoral area, as well  as with vulvar itching and pain and burning.  She has normal regular  periods.  She has had this for a significant time and used many, many  medications over a long period of time.  This maybe has affected her  psychologically, as well as the medications constantly causing skin  sensitization.  After spending a lot of time with this young lady, I am  fairly convinced that this woman has vulvodynia, typical vulvodynia.  I  do not know what the etiology was; however, she has had laser surgery  for dysplasia in the far past, which is pretty certain that she had HPV  and I think that may be 1 of the viral etiologies responsible for this  condition.  I have told the patient about the condition.  Have her  research the Internet.  I have told her we are going to keep her off all  medication, except I am going to get her started on amitriptyline.  I am  going to start her on 25 mg a day, have her come back in a couple months  if she can wait that long to see if this has helped.  I am going to give  her a little Percocet #40 to get by this beginning phase until we could  allow some time for the amitriptyline to work its wonders.   IMPRESSION:  Chronic vulvovaginal irritation and pain secondary to  vulvodynia.     ______________________________  Argentina Donovan, MD     PR/MEDQ  D:  04/14/2009  T:  04/14/2009  Job:  [id number]

## 2011-04-10 NOTE — Group Therapy Note (Signed)
Michele [name], NAKAJIMA                 ACCOUNT NO.:  [id number]   MEDICAL RECORD NO.:  [id number]          PATIENT TYPE:  WOC   [facility]ATION[facility]  [facility]                   FACILITY:  [facility]   PHYSICIAN:  Elsie Lincoln, MD      DATE OF BIRTH:  [birth year]   DATE OF SERVICE:  02/23/2009                                  CLINIC NOTE   The patient presents today as a referral for followup secondary to  diagnosis of clitoromegaly.  The patient was seen in three separate  visits between March 5 and March 8, one being at [facility] emergency  room, the other two being in the maternity admissions unit at [facility] secondary to complaint of an enlarged clitoris.  She was  evaluated by Eveline Keto, Dr. Emelda Fear and Dr. Elsie Lincoln and  diagnosed with bacterial vaginosis, started on medications and presents  today for followup.  She states that approximately 5 days after being  seen on March 8th that her symptoms resolved completely and she believes  that the clitoromegaly was caused by her use of Zyban secondary to  research on the Internet.  She does present today with a complaint of  vaginal irritation and discharge that she would desire evaluation for.  She denies recent sexual contact since her negative cultures were  performed in the maternity admissions unit.   HISTORY:  SHE IS ALLERGIC TO ALDOMET.   CURRENT MEDICATIONS:  None.   Her immunizations are up-to-date for Rubella, chickenpox and tetanus.   MENSTRUAL HISTORY:  The first day of her last menstrual period was February 10, 2009.  This was a normal period for her.  She had approximately 21-  26 days between her cycles and they last for 7 days.  She has previously  been on Yaz for contraception.  However, is out of her Yaz, but does  desire to restart.   OBSTETRICAL HISTORY:  She is a gravida 1, para 1-0-0-1.   GYNECOLOGIC HISTORY:  Her last Pap was approximately 1 year ago.  She  does have a history of an abnormal 6 years ago  that was treated with a  colpo.  She does state a mammogram 6 months ago that was normal.   SURGICAL HISTORY:  The patient has had a Cesarean section in 1986.  She  had breast augmentation and abdominoplasty.   FAMILY HISTORY:  Positive for diabetes and high blood pressure.   PERSONAL MEDICAL HISTORY:  She states chronic bladder and yeast  infections.   SOCIAL HISTORY:  She lives with her daughter.  She works outside the  home.  She denies alcohol or drug use.   SYSTEMIC REVIEW:  Her systemic review is positive only for vaginal  itching.   EXAM:  Today her exam is problem focused.  Olga is a 45 year old  Caucasian female who appears to be her stated age.  She is in no  apparent distress.  She is anxious during her interview and exam.  ABDOMEN:  Her abdominal exam is benign.  She has no masses.  Nontender.  She does have a visible lower abdominal  incision from hip to hip  consistent with abdominoplasty.  GENITALIA:  She is a Tanner 5.  All genital structures appear to be  normal and there is no longer any clitoromegaly noted.  She has no  external lesions.  Mucous membranes are pink without lesion.  There is a  moderate amount of a white creamy discharge without odor noted in the  vault.  She has regular rugae.  Her cervix is smooth, pink without  lesion and completely anterior.  A wet prep was obtained.  Bimanual  exam:  Her uterus is nonenlarged and nontender.  Adnexa not enlarged,  nontender.  Her uterus is retroverted.  Cervix is anterior pointing to  the patient's right.   IMPRESSION:  1. Vaginal irritation.  2. Resolved clitoromegaly.   PLAN:  1. We will give a prescription for Diflucan with instructions of 150      mg 3 p.o. now then 1 p.o. daily x3 days.  2. Baking soda soaks as directed p.r.n. comfort.  3. Nursing staff will call with positive wet prep results that need      further treatment.  4. The patient should follow up in 3-4 weeks for a  full annual exam,       physical and recheck of vaginitis.  5. Prescription was given for one pack of Yaz with instructions of 1      p.o. daily, to dispense one with no refills.     ______________________________  Maylon Cos, CNM    ______________________________  Elsie Lincoln, MD    SS/MEDQ  D:  02/23/2009  T:  02/23/2009  Job:  [id number]

## 2011-04-10 NOTE — Group Therapy Note (Signed)
[name], EBERLE NO.:  [id number]   MEDICAL RECORD NO.:  [id number]          PATIENT TYPE:  WOC   LOCATION[facility]                   [facility]ACILITY[facility]  [facility]   PHYSICIAN:  Dorthula Perfect, MD     DATE OF BIRTH:  August 15, 1966   DATE OF SERVICE:  05/04/2009                                  CLINIC NOTE   A 45 year old Caucasian female, gravida 1, para 1, (C-section) has been  here numerous times with recurrent vaginal symptoms.  She was last seen  here by Dr. Okey Dupre on Apr 20, 2009.  Her problem seems to be centered  around clitoral irritation, vaginal irritation, etc.  This irritation  sometimes is associated with severe pain.  She has been treated over the  past few years for recurrent yeast and/or vaginosis problems.  She is on  an antidepressant.  She uses hydrocodone for the pain .  Dr. Okey Dupre has  had suggested she go to [facility] but she states she  has no insurance.   We had a very long discussion about exposure to different chemicals,  toilet paper, etc, etc.  I have recommended that she settle on one brand  of toilet paper and have recommended Scott tissue, scent free.  I have  recommended that she settle on one laundry detergent for at least the  next 30 days and did not use fabric softener, whether it be liquid or  dryer sheets,  whether scented or even nonscented.  She does wear cotton  underwear and I do not believe that she has any exposure to nylon  products.  I have recommended that she change her soap to [place] and make  this change for 30 days also.   I did not do a GYN exam today.  She wanted her Zoloft prescription  increased.   IMPRESSION:  1. Vulvodynia, vaginal and clitoral discomfort and pain.  2. Depression.   DISPOSITION:  1. Increased Zoloft to 100 mg a day, given a prescription for 30 with      refills for a year.  2. Diflucan 150 mg, #6, with a refill.  3. I have refilled her hydrocodone 325 mg, giving her 40.  If  she      continues to require this on an ever-increasing basis, she is going      to have to go to the pain clinic and hopefully they can see her      even though she does not have insurance.   My hope is that if she gets really compulsive with that foods that she  eats, i.e. acid type foods, get very compulsive with the various soaps  and toilet papers, etc., that she uses that she will find some relief.           ______________________________  Dorthula Perfect, MD     ER/MEDQ  D:  05/04/2009  T:  05/05/2009  Job:  [id number]

## 2011-04-10 NOTE — Group Therapy Note (Signed)
[name], [name] NO.:  [id number]   MEDICAL RECORD NO.:  [id number]          PATIENT TYPE:  WOC   [facility]                   [facility]   PHYSICIAN:  Argentina Donovan, MD        DATE OF BIRTH:  26-Jan-1966   DATE OF SERVICE:  04/20/2009                                  CLINIC NOTE   The patient is a 45 year old Caucasian female treated by many doctors  over many times for recurrent vaginal symptoms.  She is a para 1-0-0-1  by C-section 9 months ago.  The patient seems to have symptoms typical  of vulvodynia as well emotional symptoms and many other areas of extreme  etiology of fear of weight gain, which apparently she had had done a lot  in the past, although she is an extremely small woman right now weighing  104 pounds and 5 feet tall.  She apparently was seen in the [facility] with  what looked like clitoromegaly, but inflamed.  They thought it may be  due to the Wellbutrin she was on, which apparently the doctor told her  could cause priapism and she stopped that, but severe clitoral pain as  there is vulva and vaginal pain, which she describes as recurrent yeast  and bacterial vaginosis type-symptoms continue to bother her.  I started  her on amitriptyline, but  after 1 week she felt so hungry every day she  felt like continually eating.  She had stopped the Wellbutrin, which was  working well for psychologically, but she does not get off of the couch  hardly now.  She wanted to go back on Prozac, which I do not think  causes priapism so we are going to start her on that.  We are also  giving her a prescription for short term for oxycodone for her pain as  well as encouraging acidification of the vagina either with vinegar  douches or yogurt.  We have also talked to her about Xylocaine ointment  and a cotton ball on the clitoris at night to relieve that pain.  Examination revealed no signs of any inflammation, although the last  time she was in on  5/20, she had some clue cells.  I gave her a  prescription for Flagyl, which she is not really excited about taking.  She also desired some Terazol-7.which I think she uses for the soothing  effect of the cream, and in short this lady has many emotional problems  that seem to be centered around the genital-clit area.   IMPRESSION:  1. Vulvodynia.  2. Depression.  I have suggested that she goes to the [facility].  She is going to go to [facility], she says.  She      goes there usually.  I have given her Dr. __________ number at the      [facility] and have suggested she      call there for an appointment.           ______________________________  Argentina Donovan, MD  PR/MEDQ  D:  04/20/2009  T:  04/20/2009  Job:  [id number]

## 2011-05-04 ENCOUNTER — Other Ambulatory Visit: Payer: Self-pay | Admitting: Family Medicine

## 2011-05-04 ENCOUNTER — Ambulatory Visit: Payer: Medicaid Other | Admitting: Family Medicine

## 2011-05-04 DIAGNOSIS — R87612 Low grade squamous intraepithelial lesion on cytologic smear of cervix (LGSIL): Secondary | ICD-10-CM

## 2011-05-04 DIAGNOSIS — N94818 Other vulvodynia: Secondary | ICD-10-CM

## 2011-05-04 DIAGNOSIS — Z0142 Encounter for cervical smear to confirm findings of recent normal smear following initial abnormal smear: Secondary | ICD-10-CM

## 2011-05-04 LAB — POCT URINALYSIS DIP (DEVICE)
Bilirubin Urine: NEGATIVE
Glucose, UA: NEGATIVE mg/dL
Hgb urine dipstick: NEGATIVE
Specific Gravity, Urine: <=1.005 (ref 1.005–1.030)
Urobilinogen, UA: 0.2 mg/dL (ref 0.0–1.0)

## 2011-05-05 NOTE — Group Therapy Note (Signed)
Michele [name], Michele [name]                 ACCOUNT NO.:  [id number]  MEDICAL RECORD NO.:  [id number]           PATIENT TYPE:  A  [facility]ATION[facility]                   [facility]  PHYSICIAN:  Lucina Mellow, DO   DATE OF BIRTH:  August 26, 1966  DATE OF SERVICE:                                 CLINIC NOTE  ADDENDUM  OB HISTORY:  The patient had mammogram on April 2011, was offered a referral for mammogram today, but the patient denied.          ______________________________ Lucina Mellow, DO    SH/MEDQ  D:  05/04/2011  T:  05/05/2011  Job:  [id number]

## 2011-05-05 NOTE — Group Therapy Note (Signed)
NAMEDILAN, FULLENWIDER                 ACCOUNT NO.:  [id number]  MEDICAL RECORD NO.:  [id number]           PATIENT TYPE:  A  [facility]ATION[facility]                   [facility]  PHYSICIAN:  Lucina Mellow, DO   DATE OF BIRTH:  1966/10/21  DATE OF SERVICE:  05/04/2011                                 CLINIC NOTE  HISTORY OF PRESENT ILLNESS:  The patient presents today for her well- woman exam.  She told the nurse in triage that she had some burning with urination, and had a urinalysis performed, which was negative.  The patient complains of burning with urination, but denies any itching, discharge, or fevers.  Denies hematuria.  The patient also concerned about having low energy and feeling weak and tired.  She was at her primary care on March 17, 2011, where they did thyroid test and her TSH was around 7.  The patient wanted to know if she should be treated.  She reports to having vulvodynia for quite some time, which is being managed with tramadol and oxycodone for breakthrough pain.  She reports this management is working for her pain.  The patient also has a history of LGSIL.  She was supposed to follow up every 6 months with pap, but she states she has had this problem for the last 15 years, and did not feel it was necessary to follow up every 6 months, but wanted to follow up today.  MEDICATION ALLERGY:  ALDOMET which she states gives her jaundice.  CURRENT MEDICATIONS: 1. Multivitamins p.o. daily. 2. Acidophilus p.o. b.i.d. 3. Citracal p.o. b.i.d. 4. Oxycodone 5 mg 1-2 tablets p.o. q.4-6 hours p.r.n. pain. 5. Lidocaine 5% p.r.n. pain topical. 6. Tramadol HCL 50 mg 3 times daily. 7. Ativan 1 mg p.o. at bedtime.  PAST MEDICAL HISTORY: 1. Vulvodynia, currently on pain management for this. 2. History of LGSIL, Pap performed today, last Pap was on May 2011. 3. Depression.  PAST SURGICAL HISTORY:  Breast reconstruction surgery.  PAST OB HISTORY:  She is a G1 P1-0-0-1,  daughter is now 27, and she is not currently on birth control as she states she is not sexually active.  SOCIAL HISTORY:  The patient states, she is smoking 1-2 cigarettes every 2 days.  Denies drinking and denies drug use beside her prescription medications.  PHYSICAL EXAMINATION:  VITAL SIGNS:  Temperature 97.9, pulse 101, blood pressure 124/84, weight is 118.3, height is 60 inches. HEART:  Regular rate and rhythm.  No MRG.  Normal S1 and S2. LUNGS:  Clear to auscultation bilaterally. ABDOMEN:  Soft, nontender.  Nontender to palpation. BREAST EXAM:  Normal.  No masses palpated.  Lymph nodes normal. GYNECOLOGICAL:  No discharge noted.  No cervical motion tenderness.  No adnexal tenderness, Pap performed.  ASSESSMENT AND PLAN:  This is a 45 year old female G1, P1 presenting for annual exam. 1. Annual exam.  Pap smear was performed, awaiting results of the Pap. 2. Follow up with primary care for her hypothyroidism. 3. Continue plan of care for vulvodynia.  Dictated by Darron Doom, PA student          ______________________________ Lucina Mellow,  DO    SH/MEDQ  D:  05/04/2011  T:  05/05/2011  Job:  [id number]

## 2011-06-08 ENCOUNTER — Other Ambulatory Visit: Payer: Self-pay

## 2011-06-08 NOTE — Telephone Encounter (Signed)
Pt wants to resume the HCTZ that Dr. Okey Dupre prescribed for her vulvar dynea.  Pt was prescribed Rx in 2010 per Dr. Okey Dupre.  Asking Dr. Debroah Loop today pt will need to make a follow up appt to further evaluate the regimen of HCTZ.  I stated it to the patient.  She was unhappy with fact of having to come in for another appt.  Pt stated she was just going to see if her PCP will prescribe it.  I informed patient that they probably would not write the Rx due to the fact the patient does not have hypertension. I informed pt that she would need to make the appt here with Dr. Okey Dupre but it may be late August before she can get an appt.  Pt stated understanding and I transferred her to front desk.

## 2011-06-27 ENCOUNTER — Telehealth: Payer: Self-pay | Admitting: *Deleted

## 2011-06-27 NOTE — Telephone Encounter (Signed)
Denise from [facility] called and wanted refill on HCTZ 25mg . Telephoned [facility] and advised this was a med pt was not taking according to our records and has not been written since 2010. So we would not be refilling.

## 2011-08-16 LAB — POCT PREGNANCY, URINE
Operator id: 247071
Preg Test, Ur: NEGATIVE

## 2011-08-16 LAB — POCT URINALYSIS DIP (DEVICE)
Bilirubin Urine: NEGATIVE
Ketones, ur: NEGATIVE
Operator id: 247071
pH: 7

## 2011-08-16 LAB — WET PREP, GENITAL
WBC, Wet Prep HPF POC: NONE SEEN
Yeast Wet Prep HPF POC: NONE SEEN

## 2011-11-13 ENCOUNTER — Other Ambulatory Visit: Payer: Self-pay | Admitting: Obstetrics & Gynecology

## 2012-05-16 ENCOUNTER — Ambulatory Visit: Payer: Medicaid Other | Admitting: Family

## 2012-05-19 ENCOUNTER — Encounter: Payer: Self-pay | Admitting: Advanced Practice Midwife

## 2012-05-19 ENCOUNTER — Other Ambulatory Visit (HOSPITAL_COMMUNITY)
Admission: RE | Admit: 2012-05-19 | Discharge: 2012-05-19 | Disposition: A | Discharge status: Home / Self Care | Payer: Medicaid Other | Source: Ambulatory Visit | Attending: Advanced Practice Midwife | Admitting: Advanced Practice Midwife

## 2012-05-19 ENCOUNTER — Ambulatory Visit (INDEPENDENT_AMBULATORY_CARE_PROVIDER_SITE_OTHER): Payer: Medicaid Other | Admitting: Advanced Practice Midwife

## 2012-05-19 VITALS — BP 133/84 | HR 94 | Temp 99.0°F | Ht 60.0 in | Wt 113.0 lb

## 2012-05-19 DIAGNOSIS — Z1159 Encounter for screening for other viral diseases: Secondary | ICD-10-CM | POA: Insufficient documentation

## 2012-05-19 DIAGNOSIS — Z01419 Encounter for gynecological examination (general) (routine) without abnormal findings: Secondary | ICD-10-CM | POA: Insufficient documentation

## 2012-05-19 DIAGNOSIS — N898 Other specified noninflammatory disorders of vagina: Secondary | ICD-10-CM

## 2012-05-19 DIAGNOSIS — Z124 Encounter for screening for malignant neoplasm of cervix: Secondary | ICD-10-CM

## 2012-05-19 DIAGNOSIS — R300 Dysuria: Secondary | ICD-10-CM

## 2012-05-19 LAB — POCT URINALYSIS DIP (DEVICE)
Hgb urine dipstick: NEGATIVE
Nitrite: NEGATIVE
Protein, ur: NEGATIVE mg/dL
Urobilinogen, UA: 0.2 mg/dL (ref 0.0–1.0)
pH: 7 (ref 5.0–8.0)

## 2012-05-19 NOTE — Progress Notes (Signed)
Patient c/o burning upon urination and pain in vagina

## 2012-05-19 NOTE — Progress Notes (Signed)
Subjective:     Michele [name] is a 46 y.o. female here for a routine exam.  Current complaints:  1. Dysuria 2. Chronic vulvodynia, Sx adequately controled w/ Tramadol and Percocet   Pt states she is very nervous about and intolerant of speculum exams.   Gynecologic History Patient's last menstrual period was 04/30/2012. Contraception: abstinence Last Pap: 04/2011. Results were: negative, but transformation zone not present. Hx LGSIL per prior chart Last mammogram: 2011. Results were: normal  Obstetric History OB History    Grav Para Term Preterm Abortions TAB SAB Ect Mult Living                   The following portions of the patient's history were reviewed and updated as appropriate: allergies, current medications, past family history, past medical history, past social history, past surgical history and problem list.  Review of Systems Genitourinary:positive for dysuria and frequency, negative for hematuria and flank pain, fever, chills    Objective:    BP 133/84  Pulse 94  Temp 99 F (37.2 C) (Oral)  Ht 5' (1.524 m)  Wt 51.256 kg (113 lb)  BMI 22.07 kg/m2  LMP 04/30/2012 General appearance: alert, cooperative, no distress and nervous Head: Normocephalic, without obvious abnormality, atraumatic Neck: no adenopathy, supple, symmetrical, trachea midline and thyroid not enlarged, symmetric, no tenderness/mass/nodules Back: negative, no CVAT Lungs: clear to auscultation bilaterally Breasts: No nipple retraction or dimpling, No nipple discharge or bleeding, No axillary or supraclavicular adenopathy, Normal to palpation without dominant masses, Taught monthly breast self examination, well-healed surgical scars present under-side of breast and aboud nipples. Implants palpated. Heart: regular rate and rhythm, S1, S2 normal, no murmur, click, rub or gallop Abdomen: soft, non-tender; bowel sounds normal; no masses,  no organomegaly Pelvic: cervix normal in appearance, external  genitalia normal, no adnexal masses or tenderness, no cervical motion tenderness, uterus normal size, shape, and consistency and vagina normal without discharge   Pap performed blindly w/out speculum. Cytobrush guided to cervix w/ index and middle fingers.   Assessment:   1. Vaginal Discharge   2. Screening for malignant neoplasm of cervix   3. Dysuria     Plan:    Education reviewed: self breast exams. Follow up in: 1 year. Schedule mammogram    1. Vaginal Discharge  Wet prep, genital  2. Screening for malignant neoplasm of cervix  Cytology - PAP  3. Dysuria  POCT urinalysis dip (device), Urine Culture   [place], [place] 6/24/201

## 2012-05-19 NOTE — Patient Instructions (Signed)
Pap Test A Pap test is a sampling of cells from a woman's cervix. The cervix is the opening between the vagina (birth canal) and the uterus (the bottom part of the womb). The cells are scraped from the cervix during a pelvic exam. These cells are then looked at under a microscope to see if the cells are normal or to see if a cancer is developing or there are changes that suggest a cancer will develop. Cervical dysplasia is a condition in which a woman has abnormal changes in the top layer of cells of her cervix. These changes are an early sign that cervical cancer may develop. Pap tests also look for the human papilloma virus (HPV) because it has 4 types that are responsible for 70% of cervical cancer. Infections can also be found during a Pap test such as bacteria, fungus, protozoa and viruses.  Cervical cancer is harder to treat and less likely to have a good outcome if left untreated. Catching the disease at an early stage leads to a better outcome. Since the Pap test was introduced 60 years ago, deaths from cervical cancer have decreased by 70%. Every woman should keep up to date with Pap tests. RISK FACTORS FOR CERVICAL CANCER INCLUDE:   Becoming sexually active before age 18.   Being the daughter of a woman who took diethylstilbestrol (DES) during pregnancy.   Having a sexual partner who has or has had cancer of the penis.   Having a sexual partner whose past partner had cervical cancer or cervical dysplasia (early cell changes which suggest a cancer may develop).   Having a weakened immune system. An example would be HIV or other immunodeficiency disorder.   Having had a sexually transmitted infection such as chlamydia, gonorrhea or HPV.   Having had an abnormal Pap or cancer of the vagina or vulva.   Having had more than one sexual partner.   A history of cervical cancer in a woman's sister or mother.   Not using condoms with new sexual partners.   Smoking.  WHO SHOULD HAVE PAP  TESTS  A Pap test is done to screen for cervical cancer.   The first Pap test should be done at age 21.   Between ages 21 and 29, Pap tests are repeated every 2 years.   Beginning at age 30, you are advised to have a Pap test every 3 years as long as your past 3 Pap tests have been normal.   Some women have medical problems that increase the chance of getting cervical cancer. Talk to your caregiver about these problems. It is especially important to talk to your caregiver if a new problem develops soon after your last Pap test. In these cases, your caregiver may recommend more frequent screening and Pap tests.   The above recommendations are the same for women who have or have not gotten the vaccine for HPV (Human Papillomavirus).   If you had a hysterectomy for a problem that was not a cancer or a condition that could lead to cancer, then you no longer need Pap tests. However, even if you no longer need a Pap test, a regular exam is a good idea to make sure no other problems are starting.    If you are between ages 65 and 70, and you have had normal Pap tests going back 10 years, you no longer need Pap tests. However, even if you no longer need a Pap test, a regular exam is a good idea   to make sure no other problems are starting.    If you have had past treatment for cervical cancer or a condition that could lead to cancer, you need Pap tests and screening for cancer for at least 20 years after your treatment.   If Pap tests have been discontinued, risk factors (such as a new sexual partner) need to be re-assessed to determine if screening should be resumed.   Some women may need screenings more often if they are at high risk for cervical cancer.  PREPARATION FOR A PAP TEST A Pap test should be performed during the weeks before the start of menstruation. Women should not douche or have sexual intercourse for 24 hours before the test. No vaginal creams, diaphragms, or tampons should be  used for 24 hours before the test. To minimize discomfort, a woman should empty her bladder just before the exam. TAKING THE PAP TEST The caregiver will perform a pelvic exam. A metal or plastic instrument (speculum) is placed in the vagina. This is done before your caregiver does a bimanual exam of your internal female organs. This instrument allows your caregiver to see the inside of the vagina and look at the cervix. A small, sterile brush is used to take a sample of cells from the internal opening of the cervix. A small wooden spatula is used to scrape the outside of the cervix. Neither of these two methods to collect cells will cause you pain. These two scrapings are placed on a glass slide or in a small bottle filled with a special liquid. The cells are looked at later under a microscope in a lab. A specialist will look at these cells and determine if the cells are normal. RESULTS OF YOUR PAP TEST  A healthy Pap test shows no abnormal cells or evidence of inflammation.   The presence of abnormally growing cells on the surface of the cervix may be reported as an abnormal Pap test. Different categories of findings are used to describe your Pap test. Your caregiver will go over the importance of these findings with you. The caregiver will then determine what follow-up is needed or when you should have your next pap test.   If you have had two or more abnormal Pap tests:   You may be asked to have a colposcopy. This is a test in which the cervix is viewed with a special lighted microscope.   A cervical tissue sample (biopsy) may also be needed. This involves taking a small tissue sample from the cervix. The sample is looked at under a microscope to find the cause of the abnormal cells. Make sure you find out the results of the Pap test. If you have not received the results within two weeks, contact your caregiver's office for the results. Do not assume everything is normal if you have not heard from  your caregiver or medical facility. It is important to follow up on all of your test results.  Document Released: 02/02/2003 Document Revised: 11/01/2011 Document Reviewed: 11/06/2011 Exit[employer] Patient Information 2012 [employer]. 

## 2012-05-20 LAB — URINE CULTURE

## 2012-05-21 ENCOUNTER — Telehealth: Payer: Self-pay | Admitting: *Deleted

## 2012-05-21 NOTE — Telephone Encounter (Signed)
Pt called requesting results, advised her of results and that pap smear is not yet resulted.

## 2012-05-26 ENCOUNTER — Telehealth: Payer: Self-pay | Admitting: *Deleted

## 2012-05-26 NOTE — Telephone Encounter (Signed)
Called pt and informed pt of normal pap results.  Pt stated understanding.

## 2012-05-26 NOTE — Telephone Encounter (Signed)
Called pt and both contact #'s has been disconnected.

## 2012-05-26 NOTE — Telephone Encounter (Signed)
Patient would like to get her pap smear results.

## 2012-08-12 ENCOUNTER — Other Ambulatory Visit: Payer: Self-pay | Admitting: Family Medicine

## 2012-08-12 DIAGNOSIS — N644 Mastodynia: Secondary | ICD-10-CM

## 2013-06-12 ENCOUNTER — Other Ambulatory Visit (HOSPITAL_COMMUNITY): Payer: Self-pay | Admitting: Internal Medicine

## 2013-06-12 DIAGNOSIS — Z1231 Encounter for screening mammogram for malignant neoplasm of breast: Secondary | ICD-10-CM

## 2013-09-04 ENCOUNTER — Ambulatory Visit (INDEPENDENT_AMBULATORY_CARE_PROVIDER_SITE_OTHER): Payer: Medicaid Other | Admitting: Advanced Practice Midwife

## 2013-09-04 ENCOUNTER — Encounter: Payer: Self-pay | Admitting: General Practice

## 2013-09-04 ENCOUNTER — Other Ambulatory Visit (HOSPITAL_COMMUNITY)
Admission: RE | Admit: 2013-09-04 | Discharge: 2013-09-04 | Disposition: A | Discharge status: Home / Self Care | Payer: Medicaid Other | Source: Ambulatory Visit | Attending: Advanced Practice Midwife | Admitting: Advanced Practice Midwife

## 2013-09-04 ENCOUNTER — Encounter: Payer: Self-pay | Admitting: Advanced Practice Midwife

## 2013-09-04 VITALS — BP 119/81 | HR 91 | Temp 97.3°F | Ht 60.0 in | Wt 107.0 lb

## 2013-09-04 DIAGNOSIS — R87612 Low grade squamous intraepithelial lesion on cytologic smear of cervix (LGSIL): Secondary | ICD-10-CM

## 2013-09-04 DIAGNOSIS — N920 Excessive and frequent menstruation with regular cycle: Secondary | ICD-10-CM

## 2013-09-04 DIAGNOSIS — Z87440 Personal history of urinary (tract) infections: Secondary | ICD-10-CM

## 2013-09-04 DIAGNOSIS — Z1151 Encounter for screening for human papillomavirus (HPV): Secondary | ICD-10-CM | POA: Insufficient documentation

## 2013-09-04 DIAGNOSIS — L708 Other acne: Secondary | ICD-10-CM

## 2013-09-04 DIAGNOSIS — L709 Acne, unspecified: Secondary | ICD-10-CM

## 2013-09-04 DIAGNOSIS — N898 Other specified noninflammatory disorders of vagina: Secondary | ICD-10-CM

## 2013-09-04 DIAGNOSIS — Z01419 Encounter for gynecological examination (general) (routine) without abnormal findings: Secondary | ICD-10-CM | POA: Insufficient documentation

## 2013-09-04 DIAGNOSIS — Z23 Encounter for immunization: Secondary | ICD-10-CM

## 2013-09-04 LAB — POCT URINALYSIS DIP (DEVICE)
Bilirubin Urine: NEGATIVE
Glucose, UA: NEGATIVE mg/dL
Leukocytes, UA: NEGATIVE
Nitrite: NEGATIVE

## 2013-09-04 MED ORDER — DROSPIRENONE-ETHINYL ESTRADIOL 3-0.03 MG PO TABS: 1.0000 | ORAL_TABLET | Freq: Every day | ORAL | Status: DC

## 2013-09-04 NOTE — Patient Instructions (Signed)
Oral Contraception Information Oral contraceptives (OCs) are medicines taken to prevent pregnancy. OCs work by preventing the ovaries from releasing eggs. The hormones in OCs also cause the cervical mucus to thicken, preventing the sperm from entering the uterus. The hormones also cause the uterine lining to become thin, not allowing a fertilized egg to attach to the inside of the uterus. OCs are highly effective when taken exactly as prescribed. However, OCs do not prevent sexually transmitted diseases (STDs). Safe sex practices, such as using condoms along with the pill, can help prevent STDs.  Before taking the pill, you may have a physical exam and Pap test. Your caregiver may order blood tests that may be necessary. Your caregiver will make sure you are a good candidate for oral contraception. Discuss with your caregiver the possible side effects of the OC you may be prescribed. When starting an OC, it can take 2 to 3 months for the body to adjust to the changes in hormone levels in your body.  TYPES OF ORAL CONTRACEPTION  The combination pill. This pill contains estrogen and progestin (synthetic progesterone) hormones. The combination pill comes in either 21-day or 28-day packs. With 21-day packs, you do not take pills for 7 days after the last pill. With 28-day packs, the pill is taken every day. The last 7 pills are without hormones. Certain types of pills have more than 21 hormone-containing pills.  The minipill. This pill contains the progesterone hormone only. It is taken every day continuously. The minipill comes in packs of 91 pills. The first 84 pills contain the hormones, and the last 7 pills do not. The last 7 days are when you will have your menstrual period. You may experience irregular spotting. ADVANTAGES  Decreases premenstrual symptoms.  Treats menstrual period cramps.  Regulates the menstrual cycle.  Decreases a heavy menstrual flow.  Treats acne.  Treats abnormal uterine  bleeding.  Treats chronic pelvic pain.  Treats polycystic ovarian syndrome.  Treats endometriosis.  Can be used as emergency contraception. DISADVANTAGES OCs can be less effective if:  You forget to take the pill at the same time every day.  You have a stomach or intestinal disease that lessens the absorption of the pill.  You take OCs with other medicines that make OCs less effective.  You take expired OCs.  You forget to restart the pill on day 7, when using the packs of 21 pills. Document Released: 02/02/2003 Document Revised: 02/04/2012 Document Reviewed: 03/21/2011 [facility] Patient Information 2014 [place], [place].  Mammogram Tips Healthy women should begin getting mammograms every year or two once they reach age 14, and once a year when they reach age 79. Here are tips:  Find an experienced, high-volume center with accomplished radiologists. You can ask for their credentials.  Ask to see the certificate showing the center is approved by the U.S. Food and Drug Administration.  Use the same center regularly, so it is easier to compare your new mammograms with your old ones.  Bring a list of places you have had mammograms, dates, biopsies or other breast treatments. Bring old mammograms with you or have them sent to your primary caregiver.  Describe any breast problems to your caregiver or the person doing the mammogram. Be ready to give past surgeries, birth control pills, hormone use, breast implants, growths, moles, breast scars and family or personal history of breast cancer.  Call your doctor or center to check on the mammogram if you hear nothing within 10 days. Do not  assume everything was normal.  To protect your privacy, the mammogram results cannot be given over the phone or to anyone but you.  Radiation from a mammogram is very low and does not pose a radiation risk.  Mammograms can detect breast problems other than breast cancer.  You may be asked stand or  sit in front of the X-ray machine.  Two small plastic or glass plates are placed around the breast when taking the X-ray.  If you are menstruating, schedule your mammogram a week after your menstrual period.  Do not wear deodorants, powder or perfume when getting a mammogram.  Wash your breasts and under your arms before getting a mammogram.  Wear cloths that are easy for you to undress and dress.  Arrive at the center at least 15 minutes before the mammogram is scheduled.  There may be slight discomfort during the mammogram, but it goes away shortly after the test.  Try to relax as much as possible during the mammogram.  Talk to your caregiver if you do not understand the results of the mammogram.  Follow the recommendations of your caregiver regarding further tests and treatments if needed.  Get a second opinion if you are concerned or question the results of the mammogram, further tests or treatment if needed.  Continue with monthly self-breast exams and yearly caregiver exams even if the mammogram is normal.  Your caregiver may recommend getting a mammogram before age 83 and more often if you are at high risk for developing breast cancer. Document Released: 02/28/2006 Document Revised: 02/04/2012 Document Reviewed: 11/05/2008 [facility] Patient Information 2014 [place], [place].  Perimenopause Perimenopause is the time when your body begins to move into the menopause (no menstrual period for 12 straight months). It is a natural process. Perimenopause can begin 2 to 8 years before the menopause and usually lasts for one year after the menopause. During this time, your ovaries may or may not produce an egg. The ovaries vary in their production of estrogen and progesterone hormones each month. This can cause irregular menstrual periods, difficulty in getting pregnant, vaginal bleeding between periods and uncomfortable symptoms. CAUSES  Irregular production of the ovarian hormones,  estrogen and progesterone, and not ovulating every month.  Other causes include:  Tumor of the pituitary gland in the brain.  Medical disease that affects the ovaries.  Radiation treatment.  Chemotherapy.  Unknown causes.  Heavy smoking and excessive alcohol intake can bring on perimenopause sooner. SYMPTOMS   Hot flashes.  Night sweats.  Irregular menstrual periods.  Decrease sex drive.  Vaginal dryness.  Headaches.  Mood swings.  Depression.  Memory problems.  Irritability.  Tiredness.  Weight gain.  Trouble getting pregnant.  The beginning of losing bone cells (osteoporosis).  The beginning of hardening of the arteries (atherosclerosis). DIAGNOSIS  Your caregiver will make a diagnosis by analyzing your age, menstrual history and your symptoms. They will do a physical exam noting any changes in your body, especially your female organs. Female hormone tests may or may not be helpful depending on the amount and when you produce the female hormones. However, other hormone tests may be helpful (ex. thyroid hormone) to rule out other problems. TREATMENT  The decision to treat during the perimenopause should be made by you and your caregiver depending on how the symptoms are affecting you and your life style. There are various treatments available such as:  Treating individual symptoms with a specific medication for that symptom (ex. tranquilizer for depression).  Herbal  medications that can help specific symptoms.  Counseling.  Group therapy.  No treatment. HOME CARE INSTRUCTIONS   Before seeing your caregiver, make a list of your menstrual periods (when the occur, how heavy they are, how long between periods and how long they last), your symptoms and when they started.  Take the medication as recommended by your caregiver.  Sleep and rest.  Exercise.  Eat a diet that contains calcium (good for your bones) and soy (acts like estrogen hormone).  Do  not smoke.  Avoid alcoholic beverages.  Taking vitamin E may help in certain cases.  Take calcium and vitamin D supplements to help prevent bone loss.  Group therapy is sometimes helpful.  Acupuncture may help in some cases. SEEK MEDICAL CARE IF:   You have any of the above and want to know if it is perimenopause.  You want advice and treatment for any of your symptoms mentioned above.  You need a referral to a specialist (gynecologist, psychiatrist or psychologist). SEEK IMMEDIATE MEDICAL CARE IF:   You have vaginal bleeding.  Your period lasts longer than 8 days.  You periods are recurring sooner than 21 days.  You have bleeding after intercourse.  You have severe depression.  You have pain when you urinate.  You have severe headaches.  You develop vision problems. Document Released: 12/20/2004 Document Revised: 02/04/2012 Document Reviewed: 09/09/2008 [facility] Patient Information 2014 [place], [place].

## 2013-09-04 NOTE — Progress Notes (Signed)
Patient ID: Michele [name], female   DOB: Dec 03, 1965, 47 y.o.   MRN: [id number]  Chief Complaint  Patient presents with  . Gynecologic Exam     HPI: Michele [name] is a 47 y.o. female.  Here for annual gyn exam w/ Pap. C/o continued menorrhagia (no change from past), worsening acne and and mood swings. Thinks she may be perimenopausal. Requesting Yasmin for menorrhagia and acne control. No Hx blood clots.    Cycles every 21-30 days. No intermenstrual bleeding. Increased vaginal discharge w/out odor or itching. No new partners. No recent IC.   Past Medical History  Diagnosis Date  . Vulvodynia   . Depression     Past Surgical History  Procedure Laterality Date  . Breast enhancement surgery    . Cesarean section      Family History  Problem Relation Age of Onset  . Hypertension Mother   . Diabetes Father     Social History History  Substance Use Topics  . Smoking status: Never Smoker   . Smokeless tobacco: Never Used  . Alcohol Use: No    Allergies  Allergen Reactions  . Methyldopa     Current Outpatient Prescriptions  Medication Sig Dispense Refill  . ALPRAZolam (XANAX) 1 MG tablet Take 1 mg by mouth at bedtime as needed.      Marland Kitchen buPROPion (WELLBUTRIN SR) 150 MG 12 hr tablet Take 200 mg by mouth 2 (two) times daily.       . clindamycin-benzoyl peroxide (BENZACLIN) gel Apply topically 2 (two) times daily.      . Multiple Vitamin (MULTIVITAMIN) tablet Take 1 tablet by mouth daily.      Marland Kitchen oxyCODONE-acetaminophen (PERCOCET) 5-325 MG per tablet Take 1-2 tablets by mouth every 4 (four) hours as needed.      . Probiotic Product (SOLUBLE FIBER/PROBIOTICS PO) Take by mouth.      . traMADol (ULTRAM) 50 MG tablet Take 50 mg by mouth every 8 (eight) hours as needed.      . tretinoin (RETIN-A) 0.1 % cream Apply topically at bedtime.      . drospirenone-ethinyl estradiol (YASMIN 28) 3-0.03 MG tablet Take 1 tablet by mouth daily.  1 Package  11   No current facility-administered  medications for this visit.    Review of Systems Review of Systems: Pertinent positives in HPI. Neg for fever, chills, abd pain, urinary complaints, GI complaints, hot flashes.  Blood pressure 119/81, pulse 91, temperature 97.3 F (36.3 C), height 5' (1.524 m), weight 107 lb (48.535 kg), last menstrual period 08/17/2013.  Physical Exam Physical Exam Physical Examination: General appearance - alert, well appearing, and in no distress, oriented to person, place, and time, overweight and anxious Mental status - normal mood, behavior, speech, dress, motor activity, and thought processes Eyes - pupils equal and reactive, extraocular eye movements intact, sclera anicteric Neck - supple, no significant adenopathy, thyroid exam: thyroid is normal in size without nodules or tenderness Chest - clear to auscultation, no wheezes, rales or rhonchi, symmetric air entry Heart - normal rate, regular rhythm, normal S1, S2, no murmurs, rubs, clicks or gallops Abdomen - soft, nontender, nondistended, no masses or organomegaly scars from previous incisions low transverse. Breasts - breasts appear normal, no suspicious masses, no skin or nipple changes or axillary nodes, bilateral implants, no palpable abnormalities otherwise, surgical scars noted under breast fold bilal and around areolas.  Pelvic - normal external genitalia, vulva, vagina, cervix, uterus and adnexa, exam limited by pt intolerance. Incomplete visualization of  cervix. , PAP: Pap smear done today, WET MOUNT done - results: collected. Small amount of this, white, odorless discharge. Back exam - No CVAT. Extremities - Homan's sign negative bilaterally Skin - generalized erythema of facial skin w/ scattered acne lesions.    Data Reviewed Pap 04/2013 ASCUS, neg HRHPV Hx LEEP for LGSIL.   Assessment Personal history of urinary (tract) infection  Acne - Plan: drospirenone-ethinyl estradiol (YASMIN 28) 3-0.03 MG tablet  Menorrhagia - Plan:  drospirenone-ethinyl estradiol (YASMIN 28) 3-0.03 MG tablet  Vaginal discharge - Plan: Wet prep, genital  Need for prophylactic vaccination and inoculation against influenza - Plan: Flu Vaccine QUAD 36+ mos IM, Flu Vaccine QUAD 36+ mos IM  Pap smear abnormality of cervix with LGSIL - Plan: Cytology - PAP  Mammogram scholarship from completed. Will schedule.   Dorathy Kinsman 09/04/2013, 12:27 PM

## 2013-09-05 LAB — WET PREP, GENITAL
Clue Cells Wet Prep HPF POC: NONE SEEN
Trich, Wet Prep: NONE SEEN

## 2013-09-09 ENCOUNTER — Telehealth: Payer: Self-pay

## 2013-09-09 NOTE — Telephone Encounter (Signed)
Pt called for test results

## 2013-09-15 NOTE — Telephone Encounter (Signed)
Patient informed of results.  

## 2014-04-13 ENCOUNTER — Other Ambulatory Visit: Payer: Self-pay | Admitting: Internal Medicine

## 2014-04-13 DIAGNOSIS — Z1231 Encounter for screening mammogram for malignant neoplasm of breast: Secondary | ICD-10-CM

## 2014-05-13 ENCOUNTER — Ambulatory Visit
Admission: RE | Admit: 2014-05-13 | Discharge: 2014-05-13 | Disposition: A | Discharge status: Home / Self Care | Payer: No Typology Code available for payment source | Source: Ambulatory Visit | Attending: Internal Medicine | Admitting: Internal Medicine

## 2014-05-13 DIAGNOSIS — Z1231 Encounter for screening mammogram for malignant neoplasm of breast: Secondary | ICD-10-CM

## 2014-09-10 ENCOUNTER — Other Ambulatory Visit: Payer: Self-pay

## 2015-08-26 ENCOUNTER — Other Ambulatory Visit (HOSPITAL_COMMUNITY)
Admission: RE | Admit: 2015-08-26 | Discharge: 2015-08-26 | Disposition: A | Discharge status: Home / Self Care | Payer: No Typology Code available for payment source | Source: Ambulatory Visit | Attending: Advanced Practice Midwife | Admitting: Advanced Practice Midwife

## 2015-08-26 ENCOUNTER — Encounter: Payer: Self-pay | Admitting: Advanced Practice Midwife

## 2015-08-26 ENCOUNTER — Other Ambulatory Visit (HOSPITAL_COMMUNITY)
Admission: RE | Admit: 2015-08-26 | Discharge: 2015-08-26 | Disposition: A | Discharge status: Home / Self Care | Payer: No Typology Code available for payment source | Source: Ambulatory Visit | Attending: Obstetrics and Gynecology | Admitting: Obstetrics and Gynecology

## 2015-08-26 ENCOUNTER — Ambulatory Visit (INDEPENDENT_AMBULATORY_CARE_PROVIDER_SITE_OTHER): Payer: No Typology Code available for payment source | Admitting: Advanced Practice Midwife

## 2015-08-26 VITALS — BP 124/86 | HR 86 | Temp 98.5°F | Ht 60.0 in | Wt 112.2 lb

## 2015-08-26 DIAGNOSIS — R140 Abdominal distension (gaseous): Secondary | ICD-10-CM | POA: Diagnosis not present

## 2015-08-26 DIAGNOSIS — Z1151 Encounter for screening for human papillomavirus (HPV): Secondary | ICD-10-CM | POA: Diagnosis not present

## 2015-08-26 DIAGNOSIS — N926 Irregular menstruation, unspecified: Secondary | ICD-10-CM | POA: Diagnosis not present

## 2015-08-26 DIAGNOSIS — Z01419 Encounter for gynecological examination (general) (routine) without abnormal findings: Secondary | ICD-10-CM | POA: Diagnosis not present

## 2015-08-26 DIAGNOSIS — Z01411 Encounter for gynecological examination (general) (routine) with abnormal findings: Secondary | ICD-10-CM | POA: Diagnosis not present

## 2015-08-26 DIAGNOSIS — Z124 Encounter for screening for malignant neoplasm of cervix: Secondary | ICD-10-CM

## 2015-08-26 LAB — POCT URINALYSIS DIP (DEVICE)
Bilirubin Urine: NEGATIVE
GLUCOSE, UA: NEGATIVE mg/dL
Hgb urine dipstick: NEGATIVE
Ketones, ur: NEGATIVE mg/dL
Leukocytes, UA: NEGATIVE
NITRITE: NEGATIVE
PH: 6 (ref 5.0–8.0)
PROTEIN: NEGATIVE mg/dL
Specific Gravity, Urine: 1.01 (ref 1.005–1.030)
UROBILINOGEN UA: 0.2 mg/dL (ref 0.0–1.0)

## 2015-08-26 NOTE — Patient Instructions (Signed)
Perimenopause Perimenopause is the time when your body begins to move into the menopause (no menstrual period for 12 straight months). It is a natural process. Perimenopause can begin 2-8 years before the menopause and usually lasts for 1 year after the menopause. During this time, your ovaries may or may not produce an egg. The ovaries vary in their production of estrogen and progesterone hormones each month. This can cause irregular menstrual periods, difficulty getting pregnant, vaginal bleeding between periods, and uncomfortable symptoms. CAUSES  Irregular production of the ovarian hormones, estrogen and progesterone, and not ovulating every month.  Other causes include:  Tumor of the pituitary gland in the brain.  Medical disease that affects the ovaries.  Radiation treatment.  Chemotherapy.  Unknown causes.  Heavy smoking and excessive alcohol intake can bring on perimenopause sooner. SIGNS AND SYMPTOMS   Hot flashes.  Night sweats.  Irregular menstrual periods.  Decreased sex drive.  Vaginal dryness.  Headaches.  Mood swings.  Depression.  Memory problems.  Irritability.  Tiredness.  Weight gain.  Trouble getting pregnant.  The beginning of losing bone cells (osteoporosis).  The beginning of hardening of the arteries (atherosclerosis). DIAGNOSIS  Your health care Michele [name] will make a diagnosis by analyzing your age, menstrual history, and symptoms. He or she will do a physical exam and note any changes in your body, especially your female organs. Female hormone tests may or may not be helpful depending on the amount of female hormones you produce and when you produce them. However, other hormone tests may be helpful to rule out other problems. TREATMENT  In some cases, no treatment is needed. The decision on whether treatment is necessary during the perimenopause should be made by you and your health care Michele [name] based on how the symptoms are affecting you  and your lifestyle. Various treatments are available, such as:  Treating individual symptoms with a specific medicine for that symptom.  Herbal medicines that can help specific symptoms.  Counseling.  Group therapy. HOME CARE INSTRUCTIONS   Keep track of your menstrual periods (when they occur, how heavy they are, how long between periods, and how long they last) as well as your symptoms and when they started.  Only take over-the-counter or prescription medicines as directed by your health care Michele [name].  Sleep and rest.  Exercise.  Eat a diet that contains calcium (good for your bones) and soy (acts like the estrogen hormone).  Do not smoke.  Avoid alcoholic beverages.  Take vitamin supplements as recommended by your health care Michele [name]. Taking vitamin E may help in certain cases.  Take calcium and vitamin D supplements to help prevent bone loss.  Group therapy is sometimes helpful.  Acupuncture may help in some cases. SEEK MEDICAL CARE IF:   You have questions about any symptoms you are having.  You need a referral to a specialist (gynecologist, psychiatrist, or psychologist). SEEK IMMEDIATE MEDICAL CARE IF:   You have vaginal bleeding.  Your period lasts longer than 8 days.  Your periods are recurring sooner than 21 days.  You have bleeding after intercourse.  You have severe depression.  You have pain when you urinate.  You have severe headaches.  You have vision problems. Document Released: 12/20/2004 Document Revised: 09/02/2013 Document Reviewed: 06/11/2013 [employer] Patient Information 2015 [employer]. This information is not intended to replace advice given to you by your health care Michele [name]. Make sure you discuss any questions you have with your health care Michele [name].  

## 2015-08-26 NOTE — Progress Notes (Signed)
  Subjective:     Michele [name] is a 49 y.o. female here for a routine exam.  Current complaints: Some abdominal bloating at times. Has irregular bleeding at times but still having pretty regular cycles every 25-27 days.  Personal health questionnaire reviewed: no.   Gynecologic History Patient's last menstrual period was 08/10/2015 (exact date). Contraception: none Last Pap: 2014. Results were: normal Last mammogram: This year. Results were: normal  Obstetric History OB History  No data available     The following portions of the patient's history were reviewed and updated as appropriate: allergies, current medications, past family history, past medical history, past social history, past surgical history and problem list.  Review of Systems Pertinent items are noted in HPI.    Objective:    BP 124/86 mmHg  Pulse 86  Temp(Src) 98.5 F (36.9 C)  Ht 5' (1.524 m)  Wt 112 lb 3.2 oz (50.894 kg)  BMI 21.91 kg/m2  LMP 08/10/2015 (Exact Date) General appearance: alert, cooperative and no distress Abdomen: soft, non-tender; bowel sounds normal; no masses,  no organomegaly Pelvic: cervix normal in appearance, external genitalia normal, no adnexal masses or tenderness, no cervical motion tenderness, rectovaginal septum normal, uterus normal size, shape, and consistency and vagina normal without discharge Extremities: extremities normal, atraumatic, no cyanosis or edema    Assessment:    Healthy female exam.   Pap done with manual blind technique, since patient can not tolerate speculum Her cervix is easily palpable anteriorly and cytobrush was inserted into endocervix and sample taken  Plan:    Follow up in: 1 year. Will order pelvic US due to patient's complaint of bloating and irregular bleeding.  She is concerned about ovarian cancer and wants ovaries checked.  I think her irregular bleeding is probably related to perimenopausal status, but will check endometrial  thickness. Discussed irregularity related to perimenopause May need endometrial biopsy at some point if irregular bleeding continues.

## 2015-08-29 LAB — CYTOLOGY - PAP

## 2015-09-16 ENCOUNTER — Telehealth: Payer: Self-pay | Admitting: *Deleted

## 2015-09-16 NOTE — Telephone Encounter (Signed)
Pt returned call to and I informed her of the negative results and that we needed to add HPV testing to her pap smear which was not done.  I advised pt that if she does here from [place] than everything is normal.  Pt asked if she would be able to see results on MyChart.  I informed pt that "yes, once the HPV has been reviewed by the provider then she should see her results.  Pt stated understanding with no further questions.

## 2015-09-16 NOTE — Telephone Encounter (Signed)
Per chart review noted pap negative but hpv testing not done. Called cytology and had hpv testing added.  Called Michele [name] and left message with a female who answered we were returning her call, please call back.

## 2015-09-16 NOTE — Telephone Encounter (Signed)
Received message left on nurse line 09/15/15 at 1316.  Patient states she would like the results of her pap smear.  States it is okay to leave a message or talk with whoever answers the phone.  Requests a return call.

## 2015-09-17 ENCOUNTER — Other Ambulatory Visit: Payer: Self-pay | Admitting: Advanced Practice Midwife

## 2015-09-17 DIAGNOSIS — R140 Abdominal distension (gaseous): Secondary | ICD-10-CM

## 2015-09-17 DIAGNOSIS — N926 Irregular menstruation, unspecified: Secondary | ICD-10-CM

## 2015-09-19 ENCOUNTER — Ambulatory Visit (HOSPITAL_COMMUNITY)
Admission: RE | Admit: 2015-09-19 | Discharge: 2015-09-19 | Disposition: A | Discharge status: Home / Self Care | Payer: No Typology Code available for payment source | Source: Ambulatory Visit | Attending: Advanced Practice Midwife | Admitting: Advanced Practice Midwife

## 2015-09-19 ENCOUNTER — Ambulatory Visit (HOSPITAL_COMMUNITY): Payer: Medicaid Other

## 2015-09-19 DIAGNOSIS — N926 Irregular menstruation, unspecified: Secondary | ICD-10-CM | POA: Diagnosis not present

## 2015-09-19 DIAGNOSIS — R140 Abdominal distension (gaseous): Secondary | ICD-10-CM | POA: Insufficient documentation

## 2015-09-29 ENCOUNTER — Encounter: Payer: Self-pay | Admitting: Advanced Practice Midwife

## 2015-09-30 ENCOUNTER — Encounter: Payer: Self-pay | Admitting: Advanced Practice Midwife

## 2015-09-30 ENCOUNTER — Other Ambulatory Visit: Payer: Self-pay | Admitting: Advanced Practice Midwife

## 2015-09-30 DIAGNOSIS — N920 Excessive and frequent menstruation with regular cycle: Secondary | ICD-10-CM

## 2015-09-30 DIAGNOSIS — L709 Acne, unspecified: Secondary | ICD-10-CM

## 2015-09-30 MED ORDER — DROSPIRENONE-ETHINYL ESTRADIOL 3-0.03 MG PO TABS: 1.0000 | ORAL_TABLET | Freq: Every day | ORAL | Status: DC

## 2015-10-03 ENCOUNTER — Telehealth: Payer: Self-pay | Admitting: *Deleted

## 2015-10-03 ENCOUNTER — Telehealth: Payer: Self-pay

## 2015-10-03 DIAGNOSIS — N920 Excessive and frequent menstruation with regular cycle: Secondary | ICD-10-CM

## 2015-10-03 DIAGNOSIS — L709 Acne, unspecified: Secondary | ICD-10-CM

## 2015-10-03 MED ORDER — DROSPIRENONE-ETHINYL ESTRADIOL 3-0.03 MG PO TABS: 1.0000 | ORAL_TABLET | Freq: Every day | ORAL | Status: DC

## 2015-10-03 NOTE — Telephone Encounter (Signed)
Error

## 2015-10-03 NOTE — Telephone Encounter (Signed)
Pt called and stated her rx for yasmin was not at the pharmacy. I reviewed chart and it appears that rx was printed. Rx resent to [facility] per patient request.

## 2016-06-21 ENCOUNTER — Other Ambulatory Visit: Payer: Self-pay | Admitting: Internal Medicine

## 2016-06-21 DIAGNOSIS — Z1231 Encounter for screening mammogram for malignant neoplasm of breast: Secondary | ICD-10-CM

## 2016-09-02 ENCOUNTER — Other Ambulatory Visit: Payer: Self-pay | Admitting: Advanced Practice Midwife

## 2016-09-02 DIAGNOSIS — N920 Excessive and frequent menstruation with regular cycle: Secondary | ICD-10-CM

## 2016-09-02 DIAGNOSIS — L709 Acne, unspecified: Secondary | ICD-10-CM

## 2016-09-03 NOTE — Telephone Encounter (Signed)
Refilled OCP one month   Needs annual exam

## 2016-10-02 ENCOUNTER — Other Ambulatory Visit: Payer: Self-pay | Admitting: Advanced Practice Midwife

## 2016-10-02 DIAGNOSIS — L709 Acne, unspecified: Secondary | ICD-10-CM

## 2016-10-02 DIAGNOSIS — N920 Excessive and frequent menstruation with regular cycle: Secondary | ICD-10-CM

## 2016-11-13 ENCOUNTER — Other Ambulatory Visit: Payer: Self-pay | Admitting: Advanced Practice Midwife

## 2016-11-13 DIAGNOSIS — N920 Excessive and frequent menstruation with regular cycle: Secondary | ICD-10-CM

## 2016-11-13 DIAGNOSIS — L709 Acne, unspecified: Secondary | ICD-10-CM

## 2016-12-05 ENCOUNTER — Other Ambulatory Visit: Payer: Self-pay | Admitting: Internal Medicine

## 2016-12-05 DIAGNOSIS — E2839 Other primary ovarian failure: Secondary | ICD-10-CM

## 2017-01-01 ENCOUNTER — Other Ambulatory Visit: Payer: Self-pay | Admitting: Advanced Practice Midwife

## 2017-01-01 DIAGNOSIS — N920 Excessive and frequent menstruation with regular cycle: Secondary | ICD-10-CM

## 2017-01-01 DIAGNOSIS — L709 Acne, unspecified: Secondary | ICD-10-CM

## 2017-01-29 ENCOUNTER — Ambulatory Visit
Admission: RE | Admit: 2017-01-29 | Discharge: 2017-01-29 | Disposition: A | Discharge status: Home / Self Care | Payer: BLUE CROSS/BLUE SHIELD | Source: Ambulatory Visit | Attending: Internal Medicine | Admitting: Internal Medicine

## 2017-01-29 DIAGNOSIS — E2839 Other primary ovarian failure: Secondary | ICD-10-CM

## 2017-05-25 ENCOUNTER — Encounter (HOSPITAL_COMMUNITY): Payer: Self-pay | Admitting: Emergency Medicine

## 2017-05-25 ENCOUNTER — Ambulatory Visit (HOSPITAL_COMMUNITY)
Admission: EM | Admit: 2017-05-25 | Discharge: 2017-05-25 | Disposition: A | Discharge status: Home / Self Care | Payer: BLUE CROSS/BLUE SHIELD | Attending: Family Medicine | Admitting: Family Medicine

## 2017-05-25 DIAGNOSIS — N94819 Vulvodynia, unspecified: Secondary | ICD-10-CM | POA: Diagnosis not present

## 2017-05-25 DIAGNOSIS — R300 Dysuria: Secondary | ICD-10-CM | POA: Insufficient documentation

## 2017-05-25 HISTORY — DX: Other chronic pain: G89.29

## 2017-05-25 LAB — POCT URINALYSIS DIP (DEVICE)
BILIRUBIN URINE: NEGATIVE
Glucose, UA: NEGATIVE mg/dL
KETONES UR: NEGATIVE mg/dL
Leukocytes, UA: NEGATIVE
Nitrite: NEGATIVE
PH: 7 (ref 5.0–8.0)
Protein, ur: NEGATIVE mg/dL
Specific Gravity, Urine: 1.01 (ref 1.005–1.030)
Urobilinogen, UA: 0.2 mg/dL (ref 0.0–1.0)

## 2017-05-25 MED ORDER — KETOROLAC TROMETHAMINE 60 MG/2ML IM SOLN
60.0000 mg | Freq: Once | INTRAMUSCULAR | Status: AC
  Administered 2017-05-25: 60 mg via INTRAMUSCULAR

## 2017-05-25 MED ORDER — KETOROLAC TROMETHAMINE 60 MG/2ML IM SOLN
INTRAMUSCULAR | Status: AC
  Filled 2017-05-25: qty 2

## 2017-05-25 MED ORDER — PHENAZOPYRIDINE HCL 200 MG PO TABS: 200.0000 mg | ORAL_TABLET | Freq: Three times a day (TID) | ORAL | 0 refills | Status: DC

## 2017-05-25 NOTE — ED Notes (Signed)
During discharge I spoke to the patient regarding her signs and symptoms. Patient and I then discussed further testing if symptoms did not improve with pyridium. Patient and then I discussed bacterial vagonisis, we agreed upon self swabbing. Dr hagler informed and order placed. Patient verbalized discharge instructions.

## 2017-05-25 NOTE — ED Triage Notes (Addendum)
Pain with urination and without urinating, location is still hurting. patient says she is trying not to urinate

## 2017-05-26 ENCOUNTER — Emergency Department (HOSPITAL_COMMUNITY)
Admission: EM | Admit: 2017-05-26 | Discharge: 2017-05-26 | Disposition: A | Discharge status: Home / Self Care | Payer: BLUE CROSS/BLUE SHIELD | Attending: Emergency Medicine | Admitting: Emergency Medicine

## 2017-05-26 ENCOUNTER — Encounter (HOSPITAL_COMMUNITY): Payer: Self-pay | Admitting: Emergency Medicine

## 2017-05-26 DIAGNOSIS — Z79899 Other long term (current) drug therapy: Secondary | ICD-10-CM | POA: Insufficient documentation

## 2017-05-26 DIAGNOSIS — E7800 Pure hypercholesterolemia, unspecified: Secondary | ICD-10-CM | POA: Insufficient documentation

## 2017-05-26 DIAGNOSIS — N94819 Vulvodynia, unspecified: Secondary | ICD-10-CM | POA: Diagnosis present

## 2017-05-26 LAB — URINALYSIS, ROUTINE W REFLEX MICROSCOPIC
BACTERIA UA: NONE SEEN
BILIRUBIN URINE: NEGATIVE
Glucose, UA: NEGATIVE mg/dL
KETONES UR: NEGATIVE mg/dL
LEUKOCYTES UA: NEGATIVE
NITRITE: POSITIVE — AB
PROTEIN: NEGATIVE mg/dL
SPECIFIC GRAVITY, URINE: 1.002 — AB (ref 1.005–1.030)
pH: 8 (ref 5.0–8.0)

## 2017-05-26 MED ORDER — DIAZEPAM 5 MG PO TABS
10.0000 mg | ORAL_TABLET | Freq: Once | ORAL | Status: AC
  Administered 2017-05-26: 10 mg via ORAL
  Filled 2017-05-26: qty 2

## 2017-05-26 MED ORDER — LIDOCAINE HCL 2 % EX GEL
1.0000 "application " | Freq: Once | CUTANEOUS | Status: AC
  Administered 2017-05-26: 1 via TOPICAL
  Filled 2017-05-26: qty 20

## 2017-05-26 MED ORDER — DIAZEPAM 5 MG PO TABS: 5.0000 mg | ORAL_TABLET | Freq: Four times a day (QID) | ORAL | 0 refills | Status: DC | PRN

## 2017-05-26 MED ORDER — HYDROMORPHONE HCL 1 MG/ML IJ SOLN: 1.0000 mg | Freq: Once | INTRAMUSCULAR | Status: DC

## 2017-05-26 MED ORDER — HYDROMORPHONE HCL 1 MG/ML IJ SOLN
0.5000 mg | Freq: Once | INTRAMUSCULAR | Status: AC
  Administered 2017-05-26: 0.5 mg via INTRAMUSCULAR
  Filled 2017-05-26: qty 0.5

## 2017-05-26 MED ORDER — LORAZEPAM 2 MG/ML IJ SOLN
1.0000 mg | Freq: Once | INTRAMUSCULAR | Status: DC
  Filled 2017-05-26: qty 1

## 2017-05-26 NOTE — ED Notes (Signed)
pts family member came to nurses desk c/o of her daughter being in serve pain. Provider was made aware

## 2017-05-26 NOTE — ED Provider Notes (Signed)
[facility] DEPT Provider Note   CSN: [id number] Arrival date & time: 05/26/17  0110     History   Chief Complaint Chief Complaint  Patient presents with  . urethra pain    HPI Michele [name] is a 51 y.o. female with a hx of vulvodynia presents to the Emergency Department complaining of gradual, persistent, progressively worsening "uretheral pain" onset mid day today. Nothing makes the symptoms better or worse.  Pt reports her vulvodynia usually has general discomfort.  She reports she normally takes tramadol and oxycodone.  She reports her last oxycodone was 1 hour ago.  Pt reports the pain is sharp in the urethra And at the vaginal introitus.  She adamantly denies abdominal or pelvic pain.  She reports she is not sexually active and has been tested for STD's in the past with negative results.  She denies vaginal discharge, but reports she is concluding her menstrual cycle.  Pt reports she was given a shot of Toradol and Pyridium at the urgent care without relief.  Pt denies known open lesions in the vaginal area and no known hx of herpes.  Pt reports the pain is constant but it worsens with urination.     The history is provided by the patient and medical records. No language interpreter was used.    Past Medical History:  Diagnosis Date  . Chronic pain   . Depression   . Vulvodynia     Patient Active Problem List   Diagnosis Date Noted  . Abdominal bloating 08/26/2015  . VAGINITIS, BACTERIAL 03/01/2010  . HYPERCHOLESTEROLEMIA 01/02/2010  . DEPRESSION 09/07/2009  . PELVIC  PAIN 05/02/2009  . CANDIDIASIS 04/22/2009  . VULVODYNIA UNSPECIFIED 04/22/2009  . ANXIETY 10/11/2008  . UTI'S, RECURRENT 10/11/2008  . BREAST MASS, RIGHT 07/29/2008  . ACNE NEC 08/14/2007  . HX, URINARY INFECTION 08/14/2007    Past Surgical History:  Procedure Laterality Date  . BREAST ENHANCEMENT SURGERY    . CESAREAN SECTION      OB History    No data available       Home Medications     Prior to Admission medications   Medication Sig Start Date End Date Taking? Authorizing Provider  ALPRAZolam Duanne Moron) 1 MG tablet Take 1 mg by mouth at bedtime as needed.    [provider]  buPROPion (WELLBUTRIN SR) 150 MG 12 hr tablet Take 200 mg by mouth 2 (two) times daily.     [provider]  clindamycin-benzoyl peroxide (BENZACLIN) gel Apply topically 2 (two) times daily.    [provider]  diazepam (VALIUM) 5 MG tablet Take 1 tablet (5 mg total) by mouth every 6 (six) hours as needed for anxiety (spasms). 05/26/17   Hiro Vipond, Jarrett Soho, PA-C  GABAPENTIN PO Take by mouth.    [provider]  HYDROCHLOROTHIAZIDE PO Take by mouth.    [provider]  Multiple Vitamin (MULTIVITAMIN) tablet Take 1 tablet by mouth daily.    [provider]  OCELLA 3-0.03 MG tablet TAKE 1 TABLET BY MOUTH EVERY DAY 11/13/16   [name], CNM  oxyCODONE-acetaminophen (PERCOCET) 5-325 MG per tablet Take 1-2 tablets by mouth every 4 (four) hours as needed.    [provider]  phenazopyridine (PYRIDIUM) 200 MG tablet Take 1 tablet (200 mg total) by mouth 3 (three) times daily. 05/25/17   [name], MD  Probiotic Product (SOLUBLE FIBER/PROBIOTICS PO) Take by mouth.    [provider]  traMADol (ULTRAM) 50 MG tablet Take 50  mg by mouth every 8 (eight) hours as needed.    [provider]  tretinoin (RETIN-A) 0.1 % cream Apply topically at bedtime.    [provider]    Family History Family History  Problem Relation Age of Onset  . Hypertension Mother   . Diabetes Father     Social History Social History  Substance Use Topics  . Smoking status: Never Smoker  . Smokeless tobacco: Never Used  . Alcohol use No     Allergies   Methyldopa   Review of Systems Review of Systems  Constitutional: Negative for appetite change, diaphoresis, fatigue, fever and unexpected weight change.  HENT: Negative for mouth  sores.   Eyes: Negative for visual disturbance.  Respiratory: Negative for cough, chest tightness, shortness of breath and wheezing.   Cardiovascular: Negative for chest pain.  Gastrointestinal: Negative for abdominal pain, constipation, diarrhea, nausea and vomiting.  Endocrine: Negative for polydipsia, polyphagia and polyuria.  Genitourinary: Positive for dysuria and vaginal pain. Negative for frequency, hematuria and urgency.  Musculoskeletal: Negative for back pain and neck stiffness.  Skin: Negative for rash.  Allergic/Immunologic: Negative for immunocompromised state.  Neurological: Negative for syncope, light-headedness and headaches.  Hematological: Does not bruise/bleed easily.  Psychiatric/Behavioral: Negative for sleep disturbance. The patient is not nervous/anxious.      Physical Exam Updated Vital Signs BP 133/66 (BP Location: Left Arm)   Pulse (!) 102   Temp 97.8 F (36.6 C) (Oral)   Resp 18   Ht 5' (1.524 m)   Wt 49.9 kg (110 lb)   LMP 05/18/2017   SpO2 100%   BMI 21.48 kg/m   Physical Exam  Constitutional: She appears well-developed and well-nourished. No distress.  Pt distressed and crying  HENT:  Head: Normocephalic and atraumatic.  Eyes: Conjunctivae are normal.  Neck: Normal range of motion.  Cardiovascular: Regular rhythm, normal heart sounds and intact distal pulses.  Tachycardia present.   No murmur heard. Pulses:      Radial pulses are 2+ on the right side, and 2+ on the left side.  Pulmonary/Chest: Effort normal and breath sounds normal. No respiratory distress. She has no wheezes.  Abdominal: Soft. Bowel sounds are normal. There is no tenderness. There is no rebound and no guarding. Hernia confirmed negative in the right inguinal area and confirmed negative in the left inguinal area.  Genitourinary: Uterus normal. No labial fusion. There is no rash, tenderness or lesion on the right labia. There is no rash, tenderness or lesion on the left labia.  Uterus is not deviated, not enlarged, not fixed and not tender. Cervix exhibits no motion tenderness, no discharge and no friability. Right adnexum displays no mass, no tenderness and no fullness. Left adnexum displays no mass, no tenderness and no fullness. No erythema, tenderness or bleeding in the vagina. No foreign body in the vagina. No signs of injury around the vagina. Vaginal discharge (scant) found.  Genitourinary Comments: No swelling, erythema or lesions  Musculoskeletal: Normal range of motion. She exhibits no edema.  Lymphadenopathy:       Right: No inguinal adenopathy present.       Left: No inguinal adenopathy present.  Neurological: She is alert.  Skin: Skin is warm and dry. She is not diaphoretic. No erythema.  Psychiatric: She has a normal mood and affect.  Nursing note and vitals reviewed.    ED Treatments / Results  Labs (all labs ordered are listed, but only abnormal results are displayed) Labs Reviewed  URINALYSIS,  ROUTINE W REFLEX MICROSCOPIC - Abnormal; Notable for the following:       Result Value   Color, Urine AMBER (*)    Specific Gravity, Urine 1.002 (*)    Hgb urine dipstick SMALL (*)    Nitrite POSITIVE (*)    Squamous Epithelial / LPF 0-5 (*)    All other components within normal limits  URINE CULTURE    Procedures Procedures (including critical care time)  Medications Ordered in ED Medications  LORazepam (ATIVAN) injection 1 mg (1 mg Intramuscular Refused 05/26/17 0332)  HYDROmorphone (DILAUDID) injection 0.5 mg (0.5 mg Intramuscular Given 05/26/17 0337)  lidocaine (XYLOCAINE) 2 % jelly 1 application (1 application Topical Given 05/26/17 0332)  diazepam (VALIUM) tablet 10 mg (10 mg Oral Given 05/26/17 0419)     Initial Impression / Assessment and Plan / ED Course  I have reviewed the triage vital signs and the nursing notes.  Pertinent labs & imaging results that were available during my care of the patient were reviewed by me and considered in my  medical decision making (see chart for details).  Clinical Course as of May 26 456  Sun May 26, 2017  0357 Pt continues to report 10/10 pain without improvement with ativan, lidocaine or ice.  Pt also reports taking gabapentin PTA.  She continues to request IV narcotics.    [HM]    Clinical Course User Index [HM] Jef Futch, Gwenlyn Perking    Patient presents with vaginal pain. She has a normal vaginal exam. She declines STD testing. Urinalysis is without evidence of urinary tract infection. She is afebrile.  Patient continues to complain of intense pain, crying in the room however she is no longer tachycardic and her blood pressure has improved. She refused Ativan and Dilaudid, lidocaine and Valium have not improved her pain. Patient has numerous treatments at home. She will be discharged with prescription for Valium but she should also use treatments at home and follow with her primary care and OB/GYN. Patient abdomen is soft and nontender. Highly doubt nephrolithiasis, appendicitis, ovarian torsion, diverticulitis or other intra-abdominal pathology.  UA is without evidence of urinary tract infection however she does have some nitrite therefore urine culture was sent.   BP (!) 123/47   Pulse 78   Temp 97.8 F (36.6 C) (Oral)   Resp 18   Ht 5' (1.524 m)   Wt 49.9 kg (110 lb)   LMP 05/18/2017   SpO2 100%   BMI 21.48 kg/m    Final Clinical Impressions(s) / ED Diagnoses   Final diagnoses:  Vulvodynia    New Prescriptions New Prescriptions   DIAZEPAM (VALIUM) 5 MG TABLET    Take 1 tablet (5 mg total) by mouth every 6 (six) hours as needed for anxiety (spasms).     Verdie Wilms, Gwenlyn Perking 05/26/17 0457    Ezequiel Essex, MD 05/26/17 [phone]

## 2017-05-26 NOTE — Discharge Instructions (Signed)
1. Medications: Valium, usual home medications (including Gabapentin, oxycodone, topical lidocaine and tramadol) 2. Treatment: rest, drink plenty of fluids, use ice 3. Follow Up: Please followup with your primary doctor and/or OB/GYN in the next 3 days for discussion of your diagnoses and further evaluation after today's visit; if you do not have a primary care doctor use the resource guide provided to find one; Please return to the ER for vomiting, fevers, worsening pain or other concerns

## 2017-05-26 NOTE — ED Triage Notes (Signed)
Reports pain in urethra for several days.  Seen in UC earlier.  Hx of vulvodynia.  Reports this feels different.  Taken oxycodone with no relief.  Given pyridium at [facility] as well.  Reports no change in pain.

## 2017-05-26 NOTE — ED Notes (Signed)
Pt called out stating she threw up in sink and she believes she threw up the Valium she was just given. Nothing seen in sink. EDP made aware.

## 2017-05-27 DIAGNOSIS — D164 Benign neoplasm of bones of skull and face: Secondary | ICD-10-CM | POA: Insufficient documentation

## 2017-05-27 LAB — URINE CULTURE: Culture: <10000 — AB

## 2017-05-27 LAB — CERVICOVAGINAL ANCILLARY ONLY
BACTERIAL VAGINITIS: NEGATIVE
Candida vaginitis: NEGATIVE

## 2017-05-27 NOTE — ED Provider Notes (Signed)
  [facility]   [id number] 05/25/17 Arrival Time: 8366  ASSESSMENT & PLAN:  Today you were diagnosed with the following: 1. Vulvodynia   2. Dysuria    If she is not improving over the next few days or feel that she is worsening she will follow up here or the Emergency Department if unable to see her regular doctor.  Meds ordered this encounter  Medications  . ketorolac (TORADOL) injection 60 mg   Pt did self-swab to check for any vaginal infection such as BV or candidiasis. Results pending.  Reviewed expectations re: course of current medical issues. Questions answered. Outlined signs and symptoms indicating need for more acute intervention. Patient verbalized understanding. After Visit Summary given.   SUBJECTIVE:  Michele [name] is a 51 y.o. female who presents with complaint of vulvodynia. History of but now more painful. Hurts with urination and afterward. No external genital skin changes or rashes. No vaginal discharge or bleeding noted. Ambulatory. Not sexually active. OTC analgesics without relief.  ROS: As per HPI.   OBJECTIVE:  Vitals:   05/25/17 1759  BP: (!) 149/103  Pulse: 88  Resp: 18  Temp: 99.2 F (37.3 C)  TempSrc: Oral  SpO2: 98%     General appearance: alert, cooperative Lungs: clear to auscultation bilaterally Heart: regular rate and rhythm Abdomen: soft, non-tender; bowel sounds normal; no masses or organomegaly; no guarding or rebound tenderness GU: declines exam Extremities: extremities normal, atraumatic, no cyanosis or edema MSK: symmetrical with no gross deformities Skin: warm and dry; no rashes or lesions Neurologic: normal gait Psychological: appears unsettled; tearful over described pain    Labs Reviewed  POCT URINALYSIS DIP (DEVICE) - Abnormal; Notable for the following:       Result Value   Hgb urine dipstick TRACE (*)    All other components within normal limits  CERVICOVAGINAL ANCILLARY ONLY     Allergies    Allergen Reactions  . Methyldopa     PMHx, SurgHx, SocialHx, Medications, and Allergies were reviewed in the Visit Navigator and updated as appropriate.       [name], MD 05/27/17 [phone]

## 2017-06-12 ENCOUNTER — Ambulatory Visit (INDEPENDENT_AMBULATORY_CARE_PROVIDER_SITE_OTHER): Payer: BLUE CROSS/BLUE SHIELD | Admitting: Advanced Practice Midwife

## 2017-06-12 ENCOUNTER — Encounter: Payer: Self-pay | Admitting: Advanced Practice Midwife

## 2017-06-12 VITALS — BP 126/87 | HR 102 | Ht 60.0 in | Wt 108.3 lb

## 2017-06-12 DIAGNOSIS — N94819 Vulvodynia, unspecified: Secondary | ICD-10-CM

## 2017-06-12 DIAGNOSIS — B3731 Acute candidiasis of vulva and vagina: Secondary | ICD-10-CM

## 2017-06-12 DIAGNOSIS — B373 Candidiasis of vulva and vagina: Secondary | ICD-10-CM | POA: Diagnosis not present

## 2017-06-12 LAB — POCT URINALYSIS DIP (DEVICE)
BILIRUBIN URINE: NEGATIVE
GLUCOSE, UA: NEGATIVE mg/dL
KETONES UR: NEGATIVE mg/dL
LEUKOCYTES UA: NEGATIVE
NITRITE: NEGATIVE
PH: 7 (ref 5.0–8.0)
Protein, ur: NEGATIVE mg/dL
Specific Gravity, Urine: 1.01 (ref 1.005–1.030)
Urobilinogen, UA: 0.2 mg/dL (ref 0.0–1.0)

## 2017-06-12 MED ORDER — DIAZEPAM 5 MG PO TABS: 5.0000 mg | ORAL_TABLET | Freq: Four times a day (QID) | ORAL | 0 refills | Status: DC | PRN

## 2017-06-12 NOTE — Progress Notes (Signed)
Subjective:    Patient ID: Michele [name], female    DOB: 06-25-1966, 51 y.o.   MRN: [id number]  Vaginal Pain  The patient's pertinent negatives include no genital itching, genital lesions, genital odor, genital rash, missed menses or pelvic pain. This is a recurrent problem. The current episode started more than 1 month ago. The problem occurs intermittently. The pain is severe. She is not pregnant. Pertinent negatives include no abdominal pain, chills, constipation, diarrhea, dysuria, fever, headaches, nausea or vomiting. She has tried oral narcotics and NSAIDs (gabapentin) for the symptoms. The treatment provided mild relief.   This is a 51 y.o. female who presents with c/o persistent long term vulvodynia.  Has had multiple treatments which do not work consistently .  Has been to ED and treated with meds.  I did a pelvic US 2 years ago which was negative. Has not been evaluated by a GYN provider for this, however. Sees an orthopedist for chronic spinal and neck pain which resulted after a fall     Has already tried gabapentin, lidocaine, and narcotics. Valium did help some.  Dilaudid helped but Oxycodone did not.  Tramadol used to help but now does not  Pain is primarily in the clitoral area. States it is searing, sharp, indescribable.  Comes and goes but is frequent.  Has been tested for infections.  Has not had any imaging. Worried it is something serious.  Has not tried PT.  States ED treated her "very badly".    ED Note: Patient presents with vaginal pain. She has a normal vaginal exam. She declines STD testing. Urinalysis is without evidence of urinary tract infection. She is afebrile.  Patient continues to complain of intense pain, crying in the room however she is no longer tachycardic and her blood pressure has improved. She refused Ativan and Dilaudid, lidocaine and Valium have not improved her pain. Patient has numerous treatments at home. She will be discharged with prescription for Valium  but she should also use treatments at home and follow with her primary care and OB/GYN. Patient abdomen is soft and nontender. Highly doubt nephrolithiasis, appendicitis, ovarian torsion, diverticulitis or other intra-abdominal pathology.  UA is without evidence of urinary tract infection however she does have some nitrite therefore urine culture was sent.  Review of Systems  Constitutional: Negative for chills and fever.  Gastrointestinal: Negative for abdominal pain, constipation, diarrhea, nausea and vomiting.  Genitourinary: Positive for vaginal pain. Negative for dysuria, missed menses and pelvic pain.  Neurological: Negative for headaches.       Objective:   Physical Exam  Constitutional: She is oriented to person, place, and time. She appears well-developed and well-nourished. No distress (but anxious and upset that "you have to do something").  HENT:  Head: Normocephalic.  Cardiovascular: Normal rate and regular rhythm.   Pulmonary/Chest: Effort normal. No respiratory distress.  Abdominal: Soft. There is no tenderness.  Genitourinary:  Genitourinary Comments: Declined pelvic exam States just had one, just wants Rx and diagnostic testing   Musculoskeletal: Normal range of motion.  Neurological: She is alert and oriented to person, place, and time.  Skin: Skin is warm and dry.  Psychiatric: She has a normal mood and affect.          Assessment & Plan:  Vulvodynia Long term chronic pain  Long discussion of condition and treatments Recommend evaluation with MRI to rule out major orthopedic issued or pelvic organ abnormalities Rx Valium #20 tabs for PRN use Has Lidocaine gel Has  Gabapentin Has Elavil Has Oxy and Tramadol given by her primary doctor Referral sent for Physical Therapy at [facility] Order sent for Pelvic MRI F/U PRN , recommend followup with primary and orthopedist as well as PT

## 2017-06-12 NOTE — Progress Notes (Signed)
Elevated score on PHQ9 but states that it's due to her pain. Does not want to see behavior clinician.

## 2017-06-12 NOTE — Patient Instructions (Signed)
Vulvar Pain  Vulvar pain is long-lasting (chronic) pain in the external female genitalia (vulva). This condition may also be called vulvodynia. The vulva includes tissues surrounding the vaginal opening and the urethra. Vulvar pain often has no known cause.  There are two main types of vulvar pain:  · Localized vulvar pain. This is when pain affects a specific area of the vulva.  ? Vulvar vestibulitis syndrome (VVS) is a type of localized vulvar pain in which pain is only felt around the opening (vestibule) of the vagina.  · Generalized vulvar pain. This is when pain affects:  ? The entire vulva.  ? Multiple areas of the vulva.  ? One side of the vulva.    What are the causes?  The cause of vulvar pain is often not known, and many factors may contribute to it. It may be a type of nerve pain (neuropathic pain).  What increases the risk?  Women who have any of the following conditions may be more likely to develop vulvar pain:  · Pelvic floor dysfunction.  · A disorder that affects a protein (interleukin 1 beta receptor antagonist) involved in inflammation in the body.  · An increased number of pain receptor nerves throughout the body.  · Painful bladder.  · Irritable bowel syndrome (IBS).  · Fibromyalgia.  · Restless sleep.  · Depression.  · Certain pain conditions.    What are the signs or symptoms?  The main symptom of this condition is pain that affects part of the vulva or the entire vulva. Pain may:  · Feel like a burning, tearing, stinging, or sharp, knife-like sensation.  · Be chronic and ongoing.  · Occur off and on (intermittently).  · Get worse when touching or putting pressure on the vulva, such as with tampon insertion, sexual intercourse, sitting for a long time, or wearing tight pants.    Symptoms may also include:  · Swelling or redness of the vulva, perineum, or inner thighs.  · Psychological or emotional distress.    How is this diagnosed?  This condition may be diagnosed based on:  · Your symptoms  and your medical history. Your health care provider may ask questions about what causes or worsens your pain.  · A physical exam of your abdomen and pelvis. This may include a cotton swab test, in which your health care provider gently touches all areas of your vulva with a cotton swab to determine where you have pain.  · Tests of your vaginal fluid. These tests may be done to check for infection.    How is this treated?  Treatment for this condition depends on the cause and severity of your pain. Treatment may include:  · Caring for your skin.  · Working with a health care provider who specializes in pain.  · Physical therapy.  · Counseling (psychotherapy). This may include:  ? Biofeedback. This is a type of therapy that helps you be more aware of your body's response to pain. It also helps you learn to deal with pain.  · Alternative medical therapies to help relieve pain, such as:  ? Hypnotherapy. This means being put in a state of altered consciousness (hypnosis).  ? Acupuncture. This is the insertion of needles into certain places on the skin.  · Surgery to remove affected parts of your vulva.    Follow these instructions at home:  Clothing  · Wear cotton underwear.  · Avoid wearing tight pants.  · Wash clothing in non-scented, mild   laundry detergent. Do not use fabric softeners or dryer sheets.  Eating and drinking  · Avoid caffeine.  · Avoid foods that are high in sugar or highly processed.  General instructions    · Take over-the-counter and prescription medicines only as told by your health care provider.  · If any soaps, lotions, creams, or ointments cause or worsen your pain, do not use these products on the affected areas.  · Do not use feminine wipes or douches.  · Do not use scented body soaps, scented pads, or scented tampons.  · Clean your vulvar skin with warm water only and pat the area dry.  · Consider using a non-scented silicone-based or oil-based lubricant during sexual intercourse.  · Keep all  follow-up visits as told by your health care provider. This is important.  Contact a health care provider if:  · Your pain gets worse or does not improve after starting treatment.  · You develop vaginal discharge that smells bad.  This information is not intended to replace advice given to you by your health care provider. Make sure you discuss any questions you have with your health care provider.  Document Released: 03/05/2016 Document Revised: 04/19/2016 Document Reviewed: 10/20/2014  Elsevier Interactive Patient Education © 2018 [employer]

## 2017-06-14 ENCOUNTER — Telehealth: Payer: Self-pay

## 2017-06-14 NOTE — Telephone Encounter (Signed)
Patient is schedule for MRI on 07/23. Called [employer] in reference to patient pre-authorization. It is currently under review by the medical doctor. They would not let me provide any more information at this time. Cpt [id number] and DX: N94.819, R10.9,R14.0. They will fax a response in 1-2 days. They can be reach at [phone].

## 2017-06-17 ENCOUNTER — Ambulatory Visit (HOSPITAL_COMMUNITY): Admission: RE | Admit: 2017-06-17 | Payer: BLUE CROSS/BLUE SHIELD | Source: Ambulatory Visit

## 2017-06-18 NOTE — Telephone Encounter (Signed)
Patient appointment has been rescheduled since we were having a hard time getting it approved.

## 2017-06-19 ENCOUNTER — Telehealth: Payer: Self-pay

## 2017-06-19 NOTE — Telephone Encounter (Signed)
error 

## 2017-06-21 NOTE — Telephone Encounter (Signed)
Per [facility] MRI not approved. Dr. Hulan Fray reviewed patients chart and agrees to see her to evaluate her pelvic pain. Message to front desk to schedule appointment.

## 2017-06-22 ENCOUNTER — Encounter: Payer: Self-pay | Admitting: Advanced Practice Midwife

## 2017-06-24 ENCOUNTER — Telehealth: Payer: Self-pay | Admitting: Obstetrics and Gynecology

## 2017-06-24 NOTE — Telephone Encounter (Signed)
Request call from RN to talk about a MRI.

## 2017-06-25 NOTE — Telephone Encounter (Signed)
Spoke with [facility] concerning patient MRI being denied by [employer]. She suggested patient returned back to office to be access by an provider [employer]. Patient will be scheduled an appointment for pelvic pain. Patient MRI will be placed on hold until Dr.Dove has made a decision.

## 2017-06-26 ENCOUNTER — Ambulatory Visit (HOSPITAL_COMMUNITY): Admission: RE | Admit: 2017-06-26 | Payer: BLUE CROSS/BLUE SHIELD | Source: Ambulatory Visit

## 2017-07-07 ENCOUNTER — Emergency Department (HOSPITAL_COMMUNITY): Payer: BLUE CROSS/BLUE SHIELD

## 2017-07-07 ENCOUNTER — Emergency Department (HOSPITAL_COMMUNITY)
Admission: EM | Admit: 2017-07-07 | Discharge: 2017-07-07 | Disposition: A | Discharge status: Home / Self Care | Payer: BLUE CROSS/BLUE SHIELD | Attending: Emergency Medicine | Admitting: Emergency Medicine

## 2017-07-07 ENCOUNTER — Encounter (HOSPITAL_COMMUNITY): Payer: Self-pay

## 2017-07-07 DIAGNOSIS — Z79899 Other long term (current) drug therapy: Secondary | ICD-10-CM | POA: Insufficient documentation

## 2017-07-07 DIAGNOSIS — R1310 Dysphagia, unspecified: Secondary | ICD-10-CM | POA: Insufficient documentation

## 2017-07-07 DIAGNOSIS — R682 Dry mouth, unspecified: Secondary | ICD-10-CM | POA: Insufficient documentation

## 2017-07-07 LAB — CBC WITH DIFFERENTIAL/PLATELET
Basophils Absolute: 0 10*3/uL (ref 0.0–0.1)
Basophils Relative: 0 %
Eosinophils Absolute: 0 10*3/uL (ref 0.0–0.7)
Eosinophils Relative: 0 %
HEMATOCRIT: 36.1 % (ref 36.0–46.0)
HEMOGLOBIN: 12.4 g/dL (ref 12.0–15.0)
LYMPHS ABS: 1.1 10*3/uL (ref 0.7–4.0)
LYMPHS PCT: 23 %
MCH: 31.4 pg (ref 26.0–34.0)
MCHC: 34.3 g/dL (ref 30.0–36.0)
MCV: 91.4 fL (ref 78.0–100.0)
MONOS PCT: 8 %
Monocytes Absolute: 0.4 10*3/uL (ref 0.1–1.0)
NEUTROS ABS: 3.4 10*3/uL (ref 1.7–7.7)
NEUTROS PCT: 69 %
Platelets: 238 10*3/uL (ref 150–400)
RBC: 3.95 MIL/uL (ref 3.87–5.11)
RDW: 13.2 % (ref 11.5–15.5)
WBC: 4.9 10*3/uL (ref 4.0–10.5)

## 2017-07-07 LAB — COMPREHENSIVE METABOLIC PANEL
ALT: 39 U/L (ref 14–54)
ANION GAP: 8 (ref 5–15)
AST: 31 U/L (ref 15–41)
Albumin: 4.5 g/dL (ref 3.5–5.0)
Alkaline Phosphatase: 30 U/L — ABNORMAL LOW (ref 38–126)
BUN: 5 mg/dL — ABNORMAL LOW (ref 6–20)
CHLORIDE: 101 mmol/L (ref 101–111)
CO2: 28 mmol/L (ref 22–32)
Calcium: 9.5 mg/dL (ref 8.9–10.3)
Creatinine, Ser: 0.68 mg/dL (ref 0.44–1.00)
GFR calc non Af Amer: >60 mL/min (ref >60)
Glucose, Bld: 111 mg/dL — ABNORMAL HIGH (ref 65–99)
Potassium: 3.5 mmol/L (ref 3.5–5.1)
SODIUM: 137 mmol/L (ref 135–145)
Total Bilirubin: 0.6 mg/dL (ref 0.3–1.2)
Total Protein: 7.4 g/dL (ref 6.5–8.1)

## 2017-07-07 LAB — URINALYSIS, ROUTINE W REFLEX MICROSCOPIC
BACTERIA UA: NONE SEEN
BILIRUBIN URINE: NEGATIVE
Glucose, UA: NEGATIVE mg/dL
KETONES UR: NEGATIVE mg/dL
LEUKOCYTES UA: NEGATIVE
NITRITE: NEGATIVE
PROTEIN: NEGATIVE mg/dL
RBC / HPF: NONE SEEN RBC/hpf (ref 0–5)
Specific Gravity, Urine: 1.002 — ABNORMAL LOW (ref 1.005–1.030)
pH: 7 (ref 5.0–8.0)

## 2017-07-07 MED ORDER — LORAZEPAM 0.5 MG PO TABS
0.5000 mg | ORAL_TABLET | Freq: Once | ORAL | Status: AC
  Administered 2017-07-07: 0.5 mg via ORAL
  Filled 2017-07-07: qty 1

## 2017-07-07 MED ORDER — IOPAMIDOL (ISOVUE-300) INJECTION 61%
INTRAVENOUS | Status: AC
  Administered 2017-07-07: 75 mL via INTRAVENOUS
  Filled 2017-07-07: qty 75

## 2017-07-07 MED ORDER — PREDNISONE 10 MG PO TABS: 20.0000 mg | ORAL_TABLET | Freq: Two times a day (BID) | ORAL | 0 refills | Status: AC

## 2017-07-07 MED ORDER — DEXAMETHASONE SODIUM PHOSPHATE 10 MG/ML IJ SOLN
10.0000 mg | Freq: Once | INTRAMUSCULAR | Status: AC
  Administered 2017-07-07: 10 mg via INTRAMUSCULAR
  Filled 2017-07-07: qty 1

## 2017-07-07 NOTE — ED Triage Notes (Signed)
Patient presents here with c/o diarrhea, weakness and difficulty swallowing. Pt state she already be at the urgent care and still no relief.

## 2017-07-07 NOTE — Discharge Instructions (Signed)
Take prednisone twice daily as prescribed for the next 5 days. Follow-up with the ENT doctor for reevaluation. Return to the ED if any concerning signs or symptoms develop.

## 2017-07-07 NOTE — ED Provider Notes (Signed)
[facility] DEPT Provider Note   CSN: [id number] Arrival date & time: 07/07/17  1621     History   Chief Complaint No chief complaint on file.   HPI Michele [name] is a 51 y.o. female with history of vulvodynia, depression, anxiety, recurrent UTIs who presents today with chief complaint acute onset, constant dysphagia for 5 days. She states that Wednesday evening she began developing difficulty swallowing as well as feeling decreased saliva production. She denies odynophagia, fevers, chills, shortness of breath, or chest pain. She states she feels "a lump "in her throat keeping her from swallowing. She states the only recent medication changes have been increasing her Neurontin dosage 1.5 months ago. She denies new soaps, lotions, detergents, or shampoos. She states she went to urgent care a few days ago found to test negative for strep and was started on Nasonex for nasal polyps. She went to a second urgent care facility in which she was given a steroid shot which states was mildly helpful temporarily. She denies regurgitation of food, facial swelling, voice change, or drooling. She also endorses diarrhea for several weeks which acutely worsened yesterday. She denies any suspicious food intake or known sick contacts. Stool is nonbloody. No abdominal pain, nausea, vomiting, or urinary symptoms.   The history is provided by the patient.    Past Medical History:  Diagnosis Date  . Chronic pain   . Depression   . Vulvodynia     Patient Active Problem List   Diagnosis Date Noted  . Osteoma of skull 05/27/2017  . Abdominal bloating 08/26/2015  . VAGINITIS, BACTERIAL 03/01/2010  . HYPERCHOLESTEROLEMIA 01/02/2010  . DEPRESSION 09/07/2009  . PELVIC  PAIN 05/02/2009  . CANDIDIASIS 04/22/2009  . VULVODYNIA UNSPECIFIED 04/22/2009  . ANXIETY 10/11/2008  . UTI'S, RECURRENT 10/11/2008  . BREAST MASS, RIGHT 07/29/2008  . ACNE NEC 08/14/2007  . HX, URINARY INFECTION 08/14/2007    Past  Surgical History:  Procedure Laterality Date  . BREAST ENHANCEMENT SURGERY    . CESAREAN SECTION      OB History    No data available       Home Medications    Prior to Admission medications   Medication Sig Start Date End Date Taking? Authorizing Provider  ALPRAZolam Duanne Moron) 1 MG tablet Take 1 mg by mouth at bedtime as needed.   Yes [provider]  buPROPion (WELLBUTRIN SR) 150 MG 12 hr tablet Take 200 mg by mouth 2 (two) times daily.    Yes [provider]  clindamycin-benzoyl peroxide (BENZACLIN) gel Apply topically 2 (two) times daily.   Yes [provider]  fluconazole (DIFLUCAN) 100 MG tablet  05/20/17  Yes [provider]  fluticasone (FLONASE) 50 MCG/ACT nasal spray USE 2 SPRAYS IN EACH NOSTRIL DAILY 05/02/17  Yes [provider]  gabapentin (NEURONTIN) 300 MG capsule Take 600 mg by mouth 3 (three) times daily.    Yes [provider]  hydrochlorothiazide (HYDRODIURIL) 25 MG tablet Take 25 mg by mouth daily.    Yes [provider]  lidocaine (XYLOCAINE) 5 % ointment APPLY EVERY 6 HOURS AS NEEDED 05/22/17  Yes [provider]  Multiple Vitamin (MULTIVITAMIN) tablet Take 1 tablet by mouth daily.   Yes [provider]  oxyCODONE-acetaminophen (PERCOCET) 5-325 MG per tablet Take 1-2 tablets by mouth every 4 (four) hours as needed.   Yes [provider]  Probiotic Product (SOLUBLE FIBER/PROBIOTICS PO) Take by mouth.   Yes [provider]  traMADol (ULTRAM) 50 MG  tablet Take 50 mg by mouth every 8 (eight) hours as needed.   Yes [provider]  tretinoin (RETIN-A) 0.1 % cream Apply topically at bedtime.   Yes [provider]  diazepam (VALIUM) 5 MG tablet Take 1 tablet (5 mg total) by mouth every 6 (six) hours as needed for anxiety. Patient not taking: Reported on 07/07/2017 06/12/17   Michele [name], CNM  OCELLA 3-0.03 MG tablet TAKE 1 TABLET BY MOUTH EVERY DAY Patient  not taking: Reported on 06/12/2017 11/13/16   Michele [name], CNM  phenazopyridine (PYRIDIUM) 200 MG tablet Take 1 tablet (200 mg total) by mouth 3 (three) times daily. Patient not taking: Reported on 06/12/2017 05/25/17   Michele Kick, MD  predniSONE (DELTASONE) 10 MG tablet Take 2 tablets (20 mg total) by mouth 2 (two) times daily with a meal. 07/07/17 07/12/17  Renita Papa, PA-C    Family History Family History  Problem Relation Age of Onset  . Hypertension Mother   . Diabetes Father     Social History Social History  Substance Use Topics  . Smoking status: Never Smoker  . Smokeless tobacco: Never Used  . Alcohol use No     Allergies   Methyldopa   Review of Systems Review of Systems  Constitutional: Negative for chills and fever.  HENT: Positive for trouble swallowing. Negative for congestion and drooling.   Respiratory: Negative for shortness of breath.   Cardiovascular: Negative for chest pain.  Gastrointestinal: Positive for diarrhea (chronic). Negative for abdominal pain, blood in stool, nausea and vomiting.  Genitourinary: Negative for dysuria, frequency and hematuria.  Musculoskeletal: Positive for back pain (chronic, unchanged).  All other systems reviewed and are negative.    Physical Exam Updated Vital Signs BP (!) 140/92 (BP Location: Right Arm)   Pulse 84   Temp 98.6 F (37 C) (Oral)   Resp 18   LMP 06/11/2017 (Exact Date)   SpO2 100%   Physical Exam  Constitutional: She appears well-developed and well-nourished. No distress.  Resting in bed, appears uncomfortable occasionally while swallowing  HENT:  Head: Normocephalic and atraumatic.  Right Ear: External ear normal.  Left Ear: External ear normal.  Mouth/Throat: Oropharynx is clear and moist.  No tenderness to palpation of the maxillary or frontal sinuses. TMs normal bilaterally without erythema or bulging. Posterior oropharynx without tonsillar hypertrophy, erythema, exudates, or uvular  deviation. No trismus or sublingual abnormalities. Nasal septum is midline with pale pink mucosa and nasal polyps bilaterally. No swelling of the lips or tongue. No drooling.   Eyes: Pupils are equal, round, and reactive to light. Conjunctivae and EOM are normal. Right eye exhibits no discharge. Left eye exhibits no discharge.  Neck: Normal range of motion. Neck supple. No JVD present. No tracheal deviation present. No thyromegaly present.  Cardiovascular: Normal rate, regular rhythm and normal heart sounds.   Pulmonary/Chest: Effort normal and breath sounds normal. No stridor. No respiratory distress. She has no wheezes. She has no rales.  Abdominal: Soft. Bowel sounds are normal. She exhibits no distension. There is no tenderness.  Musculoskeletal: She exhibits no edema.  Lymphadenopathy:    She has no cervical adenopathy.  Neurological: She is alert. No cranial nerve deficit.  Skin: Skin is warm and dry. No erythema.  Psychiatric: She has a normal mood and affect. Her behavior is normal.  Nursing note and vitals reviewed.    ED Treatments / Results  Labs (all labs ordered are listed, but only abnormal results are  displayed) Labs Reviewed  COMPREHENSIVE METABOLIC PANEL - Abnormal; Notable for the following:       Result Value   Glucose, Bld 111 (*)    BUN 5 (*)    Alkaline Phosphatase 30 (*)    All other components within normal limits  URINALYSIS, ROUTINE W REFLEX MICROSCOPIC - Abnormal; Notable for the following:    Color, Urine STRAW (*)    Specific Gravity, Urine 1.002 (*)    Hgb urine dipstick SMALL (*)    Squamous Epithelial / LPF 0-5 (*)    All other components within normal limits  CBC WITH DIFFERENTIAL/PLATELET    EKG  EKG Interpretation None       Radiology Ct Soft Tissue Neck W Contrast  Result Date: 07/07/2017 CLINICAL DATA:  51 y/o F; diarrhea, weakness, and difficulty swallowing. EXAM: CT NECK WITH CONTRAST TECHNIQUE: Multidetector CT imaging of the neck  was performed using the standard protocol following the bolus administration of intravenous contrast. CONTRAST:  75 cc Isovue-300 COMPARISON:  None. FINDINGS: Pharynx and larynx: Normal. No mass or swelling identified. Salivary glands: No inflammation, mass, or stone. Thyroid: Normal. Lymph nodes: None enlarged or abnormal density. Vascular: Negative. Limited intracranial: Negative. Visualized orbits: Negative. Mastoids and visualized paranasal sinuses: Clear. Skeleton: Mild discogenic degenerative changes of the cervical spine greatest at the C4 through C6 levels. No high-grade bony canal or foraminal stenosis. Upper chest: Negative. Other: None. IMPRESSION: No mass, collection, or inflammatory process identified. Unremarkable CT of the neck. Electronically Signed   By: Kristine Garbe M.D.   On: 07/07/2017 22:28    Procedures Procedures (including critical care time)  Medications Ordered in ED Medications  LORazepam (ATIVAN) tablet 0.5 mg (not administered)  dexamethasone (DECADRON) injection 10 mg (10 mg Intramuscular Given 07/07/17 2121)  iopamidol (ISOVUE-300) 61 % injection (75 mLs Intravenous Contrast Given 07/07/17 2221)     Initial Impression / Assessment and Plan / ED Course  I have reviewed the triage vital signs and the nursing notes.  Pertinent labs & imaging results that were available during my care of the patient were reviewed by me and considered in my medical decision making (see chart for details).     Patient with complaint of dysphagia for 3 days. Afebrile, vital signs are at patient's baseline. No evidence of angioedema, no drooling, and airway is patent. She is able to tolerate PO fluids without difficulty. No evidence of thrush. No significant electrolyte abnormalities on lab work and she does not appear dehydrated. IM Decadron given while in the ED as well as Ativan for anxiety. CT soft tissue neck with contrast shows no mass, collection, or inflammatory processes  and no other abnormalities. She is stable for discharge home with follow-up with ENT for reevaluation. Will discharge with five-day course of prednisone. Discussed indications for return to the ED. Pt verbalized understanding of and agreement with plan and is safe for discharge home at this time.  Final Clinical Impressions(s) / ED Diagnoses   Final diagnoses:  Dysphagia, unspecified type  Dry mouth    New Prescriptions New Prescriptions   PREDNISONE (DELTASONE) 10 MG TABLET    Take 2 tablets (20 mg total) by mouth 2 (two) times daily with a meal.     Renita Papa, PA-C 07/07/17 2301    Lacretia Leigh, MD 07/08/17 [phone]

## 2017-07-09 ENCOUNTER — Encounter (HOSPITAL_COMMUNITY): Payer: Self-pay | Admitting: Emergency Medicine

## 2017-07-09 ENCOUNTER — Emergency Department (HOSPITAL_COMMUNITY)
Admission: EM | Admit: 2017-07-09 | Discharge: 2017-07-09 | Disposition: A | Discharge status: Home / Self Care | Payer: BLUE CROSS/BLUE SHIELD | Attending: Emergency Medicine | Admitting: Emergency Medicine

## 2017-07-09 DIAGNOSIS — Z79899 Other long term (current) drug therapy: Secondary | ICD-10-CM | POA: Insufficient documentation

## 2017-07-09 DIAGNOSIS — R531 Weakness: Secondary | ICD-10-CM | POA: Insufficient documentation

## 2017-07-09 DIAGNOSIS — E876 Hypokalemia: Secondary | ICD-10-CM | POA: Insufficient documentation

## 2017-07-09 DIAGNOSIS — R197 Diarrhea, unspecified: Secondary | ICD-10-CM | POA: Insufficient documentation

## 2017-07-09 LAB — COMPREHENSIVE METABOLIC PANEL
ALT: 27 U/L (ref 14–54)
ANION GAP: 8 (ref 5–15)
AST: 25 U/L (ref 15–41)
Albumin: 4.4 g/dL (ref 3.5–5.0)
Alkaline Phosphatase: 30 U/L — ABNORMAL LOW (ref 38–126)
BUN: 7 mg/dL (ref 6–20)
CHLORIDE: 98 mmol/L — AB (ref 101–111)
CO2: 27 mmol/L (ref 22–32)
Calcium: 9.3 mg/dL (ref 8.9–10.3)
Creatinine, Ser: 0.75 mg/dL (ref 0.44–1.00)
GFR calc non Af Amer: >60 mL/min (ref >60)
Glucose, Bld: 100 mg/dL — ABNORMAL HIGH (ref 65–99)
Potassium: 3.1 mmol/L — ABNORMAL LOW (ref 3.5–5.1)
SODIUM: 133 mmol/L — AB (ref 135–145)
Total Bilirubin: 0.4 mg/dL (ref 0.3–1.2)
Total Protein: 7 g/dL (ref 6.5–8.1)

## 2017-07-09 LAB — CBC
HEMATOCRIT: 36.1 % (ref 36.0–46.0)
HEMOGLOBIN: 12.7 g/dL (ref 12.0–15.0)
MCH: 31.5 pg (ref 26.0–34.0)
MCHC: 35.2 g/dL (ref 30.0–36.0)
MCV: 89.6 fL (ref 78.0–100.0)
Platelets: 237 10*3/uL (ref 150–400)
RBC: 4.03 MIL/uL (ref 3.87–5.11)
RDW: 13 % (ref 11.5–15.5)
WBC: 4.2 10*3/uL (ref 4.0–10.5)

## 2017-07-09 LAB — URINALYSIS, ROUTINE W REFLEX MICROSCOPIC
Bacteria, UA: NONE SEEN
Bilirubin Urine: NEGATIVE
Glucose, UA: NEGATIVE mg/dL
Ketones, ur: NEGATIVE mg/dL
Leukocytes, UA: NEGATIVE
Nitrite: NEGATIVE
PROTEIN: NEGATIVE mg/dL
Specific Gravity, Urine: 1 — ABNORMAL LOW (ref 1.005–1.030)
WBC UA: NONE SEEN WBC/hpf (ref 0–5)
pH: 7 (ref 5.0–8.0)

## 2017-07-09 LAB — LIPASE, BLOOD: LIPASE: 39 U/L (ref 11–51)

## 2017-07-09 MED ORDER — KETOROLAC TROMETHAMINE 30 MG/ML IJ SOLN
15.0000 mg | Freq: Once | INTRAMUSCULAR | Status: DC
  Filled 2017-07-09: qty 1

## 2017-07-09 MED ORDER — SODIUM CHLORIDE 0.9 % IV BOLUS (SEPSIS)
1000.0000 mL | Freq: Once | INTRAVENOUS | Status: AC
  Administered 2017-07-09: 1000 mL via INTRAVENOUS

## 2017-07-09 MED ORDER — POTASSIUM CHLORIDE CRYS ER 20 MEQ PO TBCR
40.0000 meq | EXTENDED_RELEASE_TABLET | Freq: Once | ORAL | Status: AC
  Administered 2017-07-09: 40 meq via ORAL
  Filled 2017-07-09: qty 2

## 2017-07-09 MED ORDER — DIAZEPAM 5 MG PO TABS
5.0000 mg | ORAL_TABLET | Freq: Once | ORAL | Status: AC
  Administered 2017-07-09: 5 mg via ORAL
  Filled 2017-07-09: qty 1

## 2017-07-09 NOTE — ED Provider Notes (Signed)
[facility] DEPT Provider Note   CSN: [id number] Arrival date & time: 07/09/17  1344     History   Chief Complaint Chief Complaint  Patient presents with  . Diarrhea    HPI Michele [name] is a 51 y.o. female.  The history is provided by the patient, medical records and a relative. No language interpreter was used.  Diarrhea   Pertinent negatives include no abdominal pain, no vomiting and no headaches.   Michele [name] is a 51 y.o. female  with a PMH of depression, anxiety, chronic pain who presents to the Emergency Department complaining of generalized weakness and diarrhea. Patient endorses non-bloody loose stool 3-4 times daily over the last three weeks. She states that because of this, she feels very weak and has dry mouth. Weakness has gotten worse over the last 3-4 days. No abdominal pain, vomiting, back pain, fevers, chills, dysuria, urinary urgency/frequency. She tried taking Mylanta at home with little relief. Of note, patient was seen in the emergency department 2 days ago complaining of dysphasia. She was referred to ENT and she saw yesterday. Per chart review, normal ENT exam and symptoms were thought to be related to reflux vs. Esophageal candidia. She was started on diflucan and magic mouthwash. She has started Diflucan, but the pharmacy was out of the mouthwash, therefore she has not started taking this medication yet. During evaluation, patient is drinking out of her water bottle with no difficulty and did not mention her dysphasia today.   Past Medical History:  Diagnosis Date  . Chronic pain   . Depression   . Vulvodynia     Patient Active Problem List   Diagnosis Date Noted  . Osteoma of skull 05/27/2017  . Abdominal bloating 08/26/2015  . VAGINITIS, BACTERIAL 03/01/2010  . HYPERCHOLESTEROLEMIA 01/02/2010  . DEPRESSION 09/07/2009  . PELVIC  PAIN 05/02/2009  . CANDIDIASIS 04/22/2009  . VULVODYNIA UNSPECIFIED 04/22/2009  . ANXIETY 10/11/2008  . UTI'S, RECURRENT  10/11/2008  . BREAST MASS, RIGHT 07/29/2008  . ACNE NEC 08/14/2007  . HX, URINARY INFECTION 08/14/2007    Past Surgical History:  Procedure Laterality Date  . BREAST ENHANCEMENT SURGERY    . CESAREAN SECTION    . SKIN SURGERY      OB History    No data available       Home Medications    Prior to Admission medications   Medication Sig Start Date End Date Taking? Authorizing Provider  ALPRAZolam Duanne Moron) 1 MG tablet Take 1 mg by mouth at bedtime as needed for anxiety or sleep.    Yes [provider]  buPROPion (WELLBUTRIN SR) 100 MG 12 hr tablet Take 100 mg by mouth 2 (two) times daily.   Yes [provider]  clindamycin-benzoyl peroxide (BENZACLIN) gel Apply 1 application topically 2 (two) times daily as needed (acne).    Yes [provider]  fluconazole (DIFLUCAN) 100 MG tablet Take 100 mg by mouth daily.  05/20/17  Yes [provider]  fluticasone (FLONASE) 50 MCG/ACT nasal spray USE 2 SPRAYS IN EACH NOSTRIL DAILY prn for congestion 05/02/17  Yes [provider]  gabapentin (NEURONTIN) 300 MG capsule Take 600 mg by mouth 3 (three) times daily.    Yes [provider]  hydrochlorothiazide (HYDRODIURIL) 25 MG tablet Take 25 mg by mouth daily.    Yes [provider]  lidocaine (XYLOCAINE) 5 % ointment APPLY topically to affected area EVERY 6 HOURS AS NEEDED for pain 05/22/17  Yes [provider]  magnesium gluconate (MAGONATE) 500 MG tablet Take 500 mg by mouth daily.   Yes [provider]  mometasone (NASONEX) 50 MCG/ACT nasal spray Place 2 sprays into the nose daily.   Yes [provider]  Multiple Vitamin (MULTIVITAMIN) tablet Take 1 tablet by mouth daily.   Yes [provider]  oxyCODONE-acetaminophen (PERCOCET) 5-325 MG per tablet Take 1-2 tablets by mouth every 4 (four) hours as needed for moderate pain.    Yes [provider]  Probiotic Product (SOLUBLE FIBER/PROBIOTICS PO)  Take 1 tablet by mouth daily.    Yes [provider]  traMADol (ULTRAM) 50 MG tablet Take 50 mg by mouth every 8 (eight) hours as needed for moderate pain.    Yes [provider]  tretinoin (RETIN-A) 0.1 % cream Apply topically at bedtime.   Yes [provider]  zolpidem (AMBIEN) 10 MG tablet Take 10 mg by mouth at bedtime as needed for sleep.   Yes [provider]  diazepam (VALIUM) 5 MG tablet Take 1 tablet (5 mg total) by mouth every 6 (six) hours as needed for anxiety. Patient not taking: Reported on 07/07/2017 06/12/17   [name], CNM  OCELLA 3-0.03 MG tablet TAKE 1 TABLET BY MOUTH EVERY DAY Patient not taking: Reported on 06/12/2017 11/13/16   [name], CNM  phenazopyridine (PYRIDIUM) 200 MG tablet Take 1 tablet (200 mg total) by mouth 3 (three) times daily. Patient not taking: Reported on 06/12/2017 05/25/17   [name], MD  predniSONE (DELTASONE) 10 MG tablet Take 2 tablets (20 mg total) by mouth 2 (two) times daily with a meal. Patient not taking: Reported on 07/09/2017 07/07/17 07/12/17  Renita Papa, PA-C    Family History Family History  Problem Relation Age of Onset  . Hypertension Mother   . Diabetes Father     Social History Social History  Substance Use Topics  . Smoking status: Never Smoker  . Smokeless tobacco: Never Used  . Alcohol use No     Allergies   Methyldopa   Review of Systems Review of Systems  HENT: Positive for trouble swallowing.   Gastrointestinal: Positive for diarrhea. Negative for abdominal pain, blood in stool, nausea and vomiting.  Neurological: Positive for weakness. Negative for dizziness, syncope, numbness and headaches.  All other systems reviewed and are negative.    Physical Exam Updated Vital Signs BP 111/67 (BP Location: Right Arm)   Pulse 99   Temp 97.9 F (36.6 C) (Oral)   Resp 18   Ht 5' (1.524 m)   Wt 46.7 kg (103 lb)   LMP 06/11/2017 (Exact Date)   SpO2 100%   BMI  20.12 kg/m   Physical Exam  Constitutional: She is oriented to person, place, and time. She appears well-developed and well-nourished. No distress.  HENT:  Head: Normocephalic and atraumatic.  Cardiovascular: Normal heart sounds.   No murmur heard. Mildly tachycardic but regular.  Pulmonary/Chest: Effort normal and breath sounds normal. No respiratory distress.  Abdominal: Soft. Bowel sounds are normal. She exhibits no distension.  No abdominal or CVA tenderness.  Musculoskeletal: She exhibits no edema.  Neurological: She is alert and oriented to person, place, and time.  Skin: Skin is warm and dry.  Good cap refill.  Nursing note and vitals reviewed.    ED Treatments / Results  Labs (all labs ordered are listed, but only abnormal results are displayed) Labs Reviewed  COMPREHENSIVE METABOLIC PANEL - Abnormal; Notable for the following:  Result Value   Sodium 133 (*)    Potassium 3.1 (*)    Chloride 98 (*)    Glucose, Bld 100 (*)    Alkaline Phosphatase 30 (*)    All other components within normal limits  URINALYSIS, ROUTINE W REFLEX MICROSCOPIC - Abnormal; Notable for the following:    Color, Urine COLORLESS (*)    Specific Gravity, Urine 1.000 (*)    Hgb urine dipstick SMALL (*)    Squamous Epithelial / LPF 0-5 (*)    All other components within normal limits  GASTROINTESTINAL PANEL BY PCR, STOOL (REPLACES STOOL CULTURE)  C DIFFICILE QUICK SCREEN W PCR REFLEX  LIPASE, BLOOD  CBC  POC URINE PREG, ED    EKG  EKG Interpretation None       Radiology Ct Soft Tissue Neck W Contrast  Result Date: 07/07/2017 CLINICAL DATA:  51 y/o F; diarrhea, weakness, and difficulty swallowing. EXAM: CT NECK WITH CONTRAST TECHNIQUE: Multidetector CT imaging of the neck was performed using the standard protocol following the bolus administration of intravenous contrast. CONTRAST:  75 cc Isovue-300 COMPARISON:  None. FINDINGS: Pharynx and larynx: Normal. No mass or swelling  identified. Salivary glands: No inflammation, mass, or stone. Thyroid: Normal. Lymph nodes: None enlarged or abnormal density. Vascular: Negative. Limited intracranial: Negative. Visualized orbits: Negative. Mastoids and visualized paranasal sinuses: Clear. Skeleton: Mild discogenic degenerative changes of the cervical spine greatest at the C4 through C6 levels. No high-grade bony canal or foraminal stenosis. Upper chest: Negative. Other: None. IMPRESSION: No mass, collection, or inflammatory process identified. Unremarkable CT of the neck. Electronically Signed   By: Kristine Garbe M.D.   On: 07/07/2017 22:28    Procedures Procedures (including critical care time)  Medications Ordered in ED Medications  ketorolac (TORADOL) 30 MG/ML injection 15 mg (15 mg Intravenous Refused 07/09/17 1635)  sodium chloride 0.9 % bolus 1,000 mL (0 mLs Intravenous Stopped 07/09/17 1709)  potassium chloride SA (K-DUR,KLOR-CON) CR tablet 40 mEq (40 mEq Oral Given 07/09/17 1518)  diazepam (VALIUM) tablet 5 mg (5 mg Oral Given 07/09/17 1601)     Initial Impression / Assessment and Plan / ED Course  I have reviewed the triage vital signs and the nursing notes.  Pertinent labs & imaging results that were available during my care of the patient were reviewed by me and considered in my medical decision making (see chart for details).    Michele [name] is a 51 y.o. female who presents to ED for weakness and non-bloody loose stools x 3 weeks. Seen in emergency department 2 days, and chart reviewed from this encounter. At that time she was complaining of dysphasia causing decreased by mouth intake. She is followed up with ENT who believes this is possibly esophageal candidiasis versus reflux and started her on appropriate medications. On exam, patient is afebrile, hemodynamically stable with no abdominal tenderness. She has been drinking water bottle with no difficulty. 1L fluids given. Labs reviewed. UA without signs of  infection or dehydration. CMP with hypokalemia which was replenished in ED. Attempted to get a stool sample, however throughout entire ED stay, the patient was not able to have any diarrhea to sample. She has been ambulatory independently to the restroom multiple times. Evaluation does not show pathology that would require ongoing emergent intervention or inpatient treatment. Discussed with her the importance of following up with GI and PCP for ongoing weakness and diarrhea. Reasons to return to ER were discussed with the patient and mother at bedside. All  questions answered.   Patient discussed with Dr. Alvino Chapel who agrees with treatment plan.   Final Clinical Impressions(s) / ED Diagnoses   Final diagnoses:  Diarrhea in adult patient  Hypokalemia    New Prescriptions Discharge Medication List as of 07/09/2017  4:31 PM       Jacqueli Pangallo, Ozella Almond, PA-C 07/09/17 1738    Davonna Belling, MD 07/10/17 [phone]

## 2017-07-09 NOTE — Discharge Instructions (Signed)
It was my pleasure taking care of you today!   Please do your best to increase hydration.  I would like you to call the GI doctor to schedule a follow up appointment to further discuss your diarrhea.  I would also like you to follow up with your primary care doctor.   Return to ER for new or worsening symptoms, any additional concerns.

## 2017-07-09 NOTE — ED Triage Notes (Signed)
Pt from home with c/o weakness and dehydration secondary to diarrhea that began on Wednesday. Pt is tearful at time of assessment. Pt denies pain.

## 2017-07-10 ENCOUNTER — Other Ambulatory Visit: Payer: Self-pay | Admitting: Otolaryngology

## 2017-07-10 ENCOUNTER — Ambulatory Visit
Admission: RE | Admit: 2017-07-10 | Discharge: 2017-07-10 | Disposition: A | Discharge status: Home / Self Care | Payer: BLUE CROSS/BLUE SHIELD | Source: Ambulatory Visit | Attending: Otolaryngology | Admitting: Otolaryngology

## 2017-07-10 DIAGNOSIS — R1314 Dysphagia, pharyngoesophageal phase: Secondary | ICD-10-CM

## 2017-07-10 DIAGNOSIS — K219 Gastro-esophageal reflux disease without esophagitis: Secondary | ICD-10-CM

## 2017-07-12 ENCOUNTER — Other Ambulatory Visit: Payer: Self-pay | Admitting: Advanced Practice Midwife

## 2017-07-12 DIAGNOSIS — N94819 Vulvodynia, unspecified: Secondary | ICD-10-CM

## 2017-07-14 ENCOUNTER — Other Ambulatory Visit: Payer: Self-pay | Admitting: Advanced Practice Midwife

## 2017-07-14 DIAGNOSIS — N94819 Vulvodynia, unspecified: Secondary | ICD-10-CM

## 2017-07-15 ENCOUNTER — Encounter: Payer: Self-pay | Admitting: Obstetrics & Gynecology

## 2017-07-15 ENCOUNTER — Ambulatory Visit (INDEPENDENT_AMBULATORY_CARE_PROVIDER_SITE_OTHER): Payer: BLUE CROSS/BLUE SHIELD | Admitting: Obstetrics & Gynecology

## 2017-07-15 VITALS — BP 140/102 | HR 110 | Wt 107.1 lb

## 2017-07-15 DIAGNOSIS — R102 Pelvic and perineal pain: Secondary | ICD-10-CM | POA: Diagnosis not present

## 2017-07-15 LAB — POCT URINALYSIS DIP (DEVICE)
BILIRUBIN URINE: NEGATIVE
GLUCOSE, UA: NEGATIVE mg/dL
Hgb urine dipstick: NEGATIVE
KETONES UR: NEGATIVE mg/dL
LEUKOCYTES UA: NEGATIVE
Nitrite: NEGATIVE
Protein, ur: NEGATIVE mg/dL
Urobilinogen, UA: 0.2 mg/dL (ref 0.0–1.0)
pH: 5.5 (ref 5.0–8.0)

## 2017-07-15 NOTE — Progress Notes (Signed)
   Subjective:    Patient ID: Josilynn Losh, female    DOB: [birth year], 51 y.o.   MRN: [id number]  HPI 51 yo DW P1 (51 yo daughter) here today to discuss her vulvodynia. She has some pelvic pain and would like a MRI. I have agreed to order an u/s for this issue.   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 She called [facility] today and wants to schedule an appt.    Objective:   Physical Exam She declines an exam today.       Assessment & Plan:  Pelvic pain- gyn u/s Vulvodynia- suggest a referral to Daniel Nones, MD Her doctor at [facility], Dr. Stasia Cavalier, is trying to get her set up in a chronic pain clinic.

## 2017-07-15 NOTE — Progress Notes (Signed)
Not taking blood pressure medications  Referral to Dr.Mojeed Kerry Kass at [facility].

## 2017-07-18 ENCOUNTER — Ambulatory Visit (HOSPITAL_COMMUNITY): Payer: BLUE CROSS/BLUE SHIELD

## 2017-07-18 ENCOUNTER — Ambulatory Visit (HOSPITAL_COMMUNITY)
Admission: RE | Admit: 2017-07-18 | Discharge: 2017-07-18 | Disposition: A | Discharge status: Home / Self Care | Payer: BLUE CROSS/BLUE SHIELD | Source: Ambulatory Visit | Attending: Obstetrics & Gynecology | Admitting: Obstetrics & Gynecology

## 2017-07-18 DIAGNOSIS — R102 Pelvic and perineal pain: Secondary | ICD-10-CM | POA: Insufficient documentation

## 2017-07-23 ENCOUNTER — Encounter: Payer: Self-pay | Admitting: Advanced Practice Midwife

## 2017-07-23 ENCOUNTER — Encounter: Payer: Self-pay | Admitting: Obstetrics & Gynecology

## 2017-07-23 ENCOUNTER — Other Ambulatory Visit: Payer: Self-pay | Admitting: Advanced Practice Midwife

## 2017-07-23 DIAGNOSIS — N94819 Vulvodynia, unspecified: Secondary | ICD-10-CM

## 2017-07-24 ENCOUNTER — Encounter: Payer: Self-pay | Admitting: Obstetrics & Gynecology

## 2017-07-26 ENCOUNTER — Ambulatory Visit: Payer: BLUE CROSS/BLUE SHIELD | Admitting: Obstetrics & Gynecology

## 2017-07-30 ENCOUNTER — Telehealth: Payer: Self-pay | Admitting: Lab

## 2017-07-30 NOTE — Telephone Encounter (Signed)
Patient returned my call stating she has an appointment with [facility] health department 9/6 @11 :00. Patient also stated she wanted to keep Hansel Feinstein as her provider not Dr. Hulan Fray because Dr. Hulan Fray would not give her a Valium prescription.

## 2017-07-30 NOTE — Telephone Encounter (Signed)
Attempted to contact patient regarding a message about still having pain. No answer but I left a message to contact our office when she received my message.

## 2017-08-01 ENCOUNTER — Ambulatory Visit (INDEPENDENT_AMBULATORY_CARE_PROVIDER_SITE_OTHER): Payer: BLUE CROSS/BLUE SHIELD | Admitting: Psychiatry

## 2017-08-01 ENCOUNTER — Encounter (HOSPITAL_COMMUNITY): Payer: Self-pay | Admitting: Psychiatry

## 2017-08-01 VITALS — BP 155/94 | HR 100 | Ht 60.0 in | Wt 106.2 lb

## 2017-08-01 DIAGNOSIS — F411 Generalized anxiety disorder: Secondary | ICD-10-CM | POA: Diagnosis not present

## 2017-08-01 DIAGNOSIS — F410 Panic disorder [episodic paroxysmal anxiety] without agoraphobia: Secondary | ICD-10-CM

## 2017-08-01 DIAGNOSIS — F3341 Major depressive disorder, recurrent, in partial remission: Secondary | ICD-10-CM

## 2017-08-01 DIAGNOSIS — F5104 Psychophysiologic insomnia: Secondary | ICD-10-CM

## 2017-08-01 MED ORDER — GUANFACINE HCL 1 MG PO TABS: 1.0000 mg | ORAL_TABLET | Freq: Two times a day (BID) | ORAL | 2 refills | Status: DC

## 2017-08-01 MED ORDER — ZOLPIDEM TARTRATE 10 MG PO TABS: 10.0000 mg | ORAL_TABLET | Freq: Every evening | ORAL | 2 refills | Status: DC | PRN

## 2017-08-01 MED ORDER — BUPROPION HCL ER (SR) 100 MG PO TB12
100.0000 mg | ORAL_TABLET | Freq: Two times a day (BID) | ORAL | 1 refills | Status: AC
  Filled 2024-09-21: qty 180, 90d supply, fill #0
  Filled 2024-12-05: qty 180, 90d supply, fill #1

## 2017-08-01 MED ORDER — ALPRAZOLAM 0.5 MG PO TABS
1.0000 mg | ORAL_TABLET | Freq: Three times a day (TID) | ORAL | 2 refills | Status: DC | PRN
Start: 2017-08-01 — End: 2017-09-03
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
## 2017-08-08 ENCOUNTER — Ambulatory Visit (HOSPITAL_COMMUNITY): Payer: Self-pay | Admitting: Licensed Clinical Social Worker

## 2017-08-13 NOTE — Telephone Encounter (Signed)
Error encounter. 

## 2017-08-13 NOTE — Telephone Encounter (Signed)
Error Encounter

## 2017-09-02 ENCOUNTER — Telehealth (HOSPITAL_COMMUNITY): Payer: Self-pay

## 2017-09-02 DIAGNOSIS — F411 Generalized anxiety disorder: Secondary | ICD-10-CM

## 2017-09-02 DIAGNOSIS — F410 Panic disorder [episodic paroxysmal anxiety] without agoraphobia: Secondary | ICD-10-CM

## 2017-09-02 NOTE — Telephone Encounter (Signed)
Patient is calling for clarification on her Xanax. The prescription reads to take 2 tabs (1mg ) of 0.5 mg three times a day and is written for a quantity of 90. Patient was concerned she may be taking it wrong because the directions and the quantity do not match. Please clarify, thank you

## 2017-09-03 MED ORDER — ALPRAZOLAM 0.5 MG PO TABS: 0.5000 mg | ORAL_TABLET | Freq: Three times a day (TID) | ORAL | 1 refills | Status: DC | PRN

## 2017-09-03 NOTE — Telephone Encounter (Signed)
Hmm, I remember her.  I'm not terribly sure how (2 tablets) TID got in there, I'm suspecting it was an auto-populated typo.  Our conversation and the treatment plan that we agreed on was that we would continue xanax 0.5 mg TID, and we would discontinue valium.  Michele [name] needs to be using the dose that we discussed which is  0.5mg  (1 tablet) 3 times per day (as needed).  If she doesn't feel like she needs a dose at certain times of the day, she should skip a dose. Please remind her that part of our treatment plan is that we will tapering her off of this, so I anticipate we will be reducing to twice a day at our follow-up visit.  Thank you!

## 2017-09-03 NOTE — Telephone Encounter (Signed)
Okay, I called the pharmacy and discontinued the order for 2 po tid and changed it to 1 po tid. I also called the patient and let her know, she was understanding and in agreement with the plan.

## 2017-09-18 ENCOUNTER — Ambulatory Visit (HOSPITAL_COMMUNITY): Payer: BLUE CROSS/BLUE SHIELD | Admitting: Psychiatry

## 2017-10-21 ENCOUNTER — Telehealth (HOSPITAL_COMMUNITY): Payer: Self-pay

## 2017-10-21 DIAGNOSIS — F411 Generalized anxiety disorder: Secondary | ICD-10-CM

## 2017-10-21 DIAGNOSIS — F410 Panic disorder [episodic paroxysmal anxiety] without agoraphobia: Secondary | ICD-10-CM

## 2017-10-21 NOTE — Telephone Encounter (Signed)
Medication refill request - Fax received from [facility]. for a refill of Guanfacine, last ordered 08/01/17 + 2 refills and last filled 09/24/17. Pt. cance

## 2017-10-21 NOTE — Telephone Encounter (Signed)
Sure that is fine.

## 2017-10-22 MED ORDER — GUANFACINE HCL 1 MG PO TABS: 1.0000 mg | ORAL_TABLET | Freq: Two times a day (BID) | ORAL | 0 refills | Status: DC

## 2017-10-22 NOTE — Telephone Encounter (Signed)
A new 30 day order for patient's prescribed Guanfacine 1 mg, one twice a day, #60 with no refills and note evaluation required for further refills e-scribed to patient's [facility] on [address] as approved and authorized by Dr. Daron Offer this date.

## 2017-11-01 ENCOUNTER — Ambulatory Visit (HOSPITAL_COMMUNITY): Payer: Self-pay | Admitting: Psychiatry

## 2018-08-17 ENCOUNTER — Encounter: Payer: Self-pay | Admitting: Emergency Medicine

## 2018-08-17 ENCOUNTER — Emergency Department (HOSPITAL_COMMUNITY)
Admission: EM | Admit: 2018-08-17 | Discharge: 2018-08-17 | Discharge status: Against Medical Advice | Payer: BLUE CROSS/BLUE SHIELD | Attending: Emergency Medicine | Admitting: Emergency Medicine

## 2018-08-17 DIAGNOSIS — F419 Anxiety disorder, unspecified: Secondary | ICD-10-CM | POA: Diagnosis present

## 2018-08-17 DIAGNOSIS — Z5321 Procedure and treatment not carried out due to patient leaving prior to being seen by health care provider: Secondary | ICD-10-CM | POA: Insufficient documentation

## 2018-08-17 NOTE — ED Notes (Signed)
Pt advised of the wait for provider, pt states she doesn't feel like she wants to wait.

## 2018-08-17 NOTE — ED Triage Notes (Signed)
Pt c/o anxiety and possible medication reaction. Pt states she started butrans patch 3 days ago. Pt stated she had a headache earlier and was worried to go to sleep so she administered Narcan to self intranasally. Pt states started to feel "weird" and anxious and felt like she couldn't breath through nostril. Pt appears anxious in triage with flight of ideas but able to talk without difficulty, sats 100% RA. Pt arrived with family.

## 2018-09-23 ENCOUNTER — Ambulatory Visit: Payer: Self-pay | Admitting: Student

## 2018-09-25 ENCOUNTER — Ambulatory Visit: Payer: Self-pay | Admitting: Family Medicine

## 2018-10-02 ENCOUNTER — Ambulatory Visit (INDEPENDENT_AMBULATORY_CARE_PROVIDER_SITE_OTHER): Payer: BLUE CROSS/BLUE SHIELD | Admitting: Internal Medicine

## 2018-10-02 ENCOUNTER — Encounter: Payer: Self-pay | Admitting: Internal Medicine

## 2018-10-02 ENCOUNTER — Other Ambulatory Visit (HOSPITAL_COMMUNITY)
Admission: RE | Admit: 2018-10-02 | Discharge: 2018-10-02 | Disposition: A | Discharge status: Home / Self Care | Payer: BLUE CROSS/BLUE SHIELD | Source: Ambulatory Visit | Attending: Student | Admitting: Student

## 2018-10-02 VITALS — Wt 110.0 lb

## 2018-10-02 DIAGNOSIS — Z0000 Encounter for general adult medical examination without abnormal findings: Secondary | ICD-10-CM | POA: Diagnosis not present

## 2018-10-02 LAB — POCT URINALYSIS DIP (DEVICE)
Bilirubin Urine: NEGATIVE
GLUCOSE, UA: NEGATIVE mg/dL
KETONES UR: NEGATIVE mg/dL
LEUKOCYTES UA: NEGATIVE
Nitrite: NEGATIVE
PROTEIN: NEGATIVE mg/dL
Specific Gravity, Urine: 1.01 (ref 1.005–1.030)
Urobilinogen, UA: 0.2 mg/dL (ref 0.0–1.0)
pH: 7 (ref 5.0–8.0)

## 2018-10-02 NOTE — Progress Notes (Signed)
   Subjective:    Michele [name] - 52 y.o. female MRN [id number]  Date of birth: 1966/09/05  HPI  Michele [name] is a 52 y.o. female here for annual exam. Patient with history of vulvodynia. She requests PAP testing today. Explained that she had normal PAP in 2016 with negative HPV and she could potentially go until 2021 without repeat cervical cancer screening. However, patient requests testing today. She is unable to tolerate speculum exam due to vulvodynia; has had blind cytobrush swab in the past. Also requests STD testing today.     OB History   None      -  reports that she has never smoked. She has never used smokeless tobacco. - Review of Systems: Per HPI. - Past Medical History: Patient Active Problem List   Diagnosis Date Noted  . Osteoma of skull 05/27/2017  . Abdominal bloating 08/26/2015  . VAGINITIS, BACTERIAL 03/01/2010  . HYPERCHOLESTEROLEMIA 01/02/2010  . DEPRESSION 09/07/2009  . PELVIC  PAIN 05/02/2009  . CANDIDIASIS 04/22/2009  . VULVODYNIA UNSPECIFIED 04/22/2009  . ANXIETY 10/11/2008  . UTI'S, RECURRENT 10/11/2008  . BREAST MASS, RIGHT 07/29/2008  . ACNE NEC 08/14/2007  . HX, URINARY INFECTION 08/14/2007   - Medications: reviewed and updated   Objective:   Physical Exam Wt 49.9 kg   BMI 21.48 kg/m  Gen: NAD, alert, cooperative with exam, well-appearing HEENT: NCAT, PERRL, clear conjunctiva, oropharynx clear, supple neck Breast: Declines breast exam.  CV: RRR, good S1/S2, no murmur, no edema, capillary refill brisk  Resp: CTABL, no wheezes, non-labored Abd: SNTND, BS present, no guarding or organomegaly Skin: no rashes, normal turgor  Neuro: no gross deficits.  Psych: good insight, alert and oriented GU/GYN: Exam performed in the presence of a chaperone. External genitalia within normal limits. Cytobrush inserted into vaginal and blind swab performed.        Assessment & Plan:   1. Annual physical exam - Cytology - PAP with STD screening,  discussed with patient that blind sample is less likely to adequately sample cervical cells  -patient declines mammogram   Routine preventative health maintenance measures emphasized. Please refer to After Visit Summary for other counseling recommendations.   Return in about 1 year (around 10/03/2019) for annual exam.  Phill Myron, D.O. OB Fellow  10/05/2018, 9:51 PM

## 2018-10-02 NOTE — Patient Instructions (Signed)
Preventive Care 40-64 Years, Female Preventive care refers to lifestyle choices and visits with your health care provider that can promote health and wellness. What does preventive care include?  A yearly physical exam. This is also called an annual well check.  Dental exams once or twice a year.  Routine eye exams. Ask your health care provider how often you should have your eyes checked.  Personal lifestyle choices, including: ? Daily care of your teeth and gums. ? Regular physical activity. ? Eating a healthy diet. ? Avoiding tobacco and drug use. ? Limiting alcohol use. ? Practicing safe sex. ? Taking low-dose aspirin daily starting at age 58. ? Taking vitamin and mineral supplements as recommended by your health care provider. What happens during an annual well check? The services and screenings done by your health care provider during your annual well check will depend on your age, overall health, lifestyle risk factors, and family history of disease. Counseling Your health care provider may ask you questions about your:  Alcohol use.  Tobacco use.  Drug use.  Emotional well-being.  Home and relationship well-being.  Sexual activity.  Eating habits.  Work and work Statistician.  Method of birth control.  Menstrual cycle.  Pregnancy history.  Screening You may have the following tests or measurements:  Height, weight, and BMI.  Blood pressure.  Lipid and cholesterol levels. These may be checked every 5 years, or more frequently if you are over 81 years old.  Skin check.  Lung cancer screening. You may have this screening every year starting at age 78 if you have a 30-pack-year history of smoking and currently smoke or have quit within the past 15 years.  Fecal occult blood test (FOBT) of the stool. You may have this test every year starting at age 65.  Flexible sigmoidoscopy or colonoscopy. You may have a sigmoidoscopy every 5 years or a colonoscopy  every 10 years starting at age 30.  Hepatitis C blood test.  Hepatitis B blood test.  Sexually transmitted disease (STD) testing.  Diabetes screening. This is done by checking your blood sugar (glucose) after you have not eaten for a while (fasting). You may have this done every 1-3 years.  Mammogram. This may be done every 1-2 years. Talk to your health care provider about when you should start having regular mammograms. This may depend on whether you have a family history of breast cancer.  BRCA-related cancer screening. This may be done if you have a family history of breast, ovarian, tubal, or peritoneal cancers.  Pelvic exam and Pap test. This may be done every 3 years starting at age 80. Starting at age 36, this may be done every 5 years if you have a Pap test in combination with an HPV test.  Bone density scan. This is done to screen for osteoporosis. You may have this scan if you are at high risk for osteoporosis.  Discuss your test results, treatment options, and if necessary, the need for more tests with your health care provider. Vaccines Your health care provider may recommend certain vaccines, such as:  Influenza vaccine. This is recommended every year.  Tetanus, diphtheria, and acellular pertussis (Tdap, Td) vaccine. You may need a Td booster every 10 years.  Varicella vaccine. You may need this if you have not been vaccinated.  Zoster vaccine. You may need this after age 5.  Measles, mumps, and rubella (MMR) vaccine. You may need at least one dose of MMR if you were born in  1957 or later. You may also need a second dose.  Pneumococcal 13-valent conjugate (PCV13) vaccine. You may need this if you have certain conditions and were not previously vaccinated.  Pneumococcal polysaccharide (PPSV23) vaccine. You may need one or two doses if you smoke cigarettes or if you have certain conditions.  Meningococcal vaccine. You may need this if you have certain  conditions.  Hepatitis A vaccine. You may need this if you have certain conditions or if you travel or work in places where you may be exposed to hepatitis A.  Hepatitis B vaccine. You may need this if you have certain conditions or if you travel or work in places where you may be exposed to hepatitis B.  Haemophilus influenzae type b (Hib) vaccine. You may need this if you have certain conditions.  Talk to your health care provider about which screenings and vaccines you need and how often you need them. This information is not intended to replace advice given to you by your health care provider. Make sure you discuss any questions you have with your health care provider. Document Released: 12/09/2015 Document Revised: 08/01/2016 Document Reviewed: 09/13/2015 Elsevier Interactive Patient Education  2018 Elsevier Inc.  

## 2018-10-06 LAB — CYTOLOGY - PAP
BACTERIAL VAGINITIS: NEGATIVE
CANDIDA VAGINITIS: NEGATIVE
Chlamydia: NEGATIVE
DIAGNOSIS: NEGATIVE
HPV: DETECTED — AB
NEISSERIA GONORRHEA: NEGATIVE
Trichomonas: NEGATIVE

## 2019-05-11 ENCOUNTER — Ambulatory Visit (INDEPENDENT_AMBULATORY_CARE_PROVIDER_SITE_OTHER): Payer: BC Managed Care – PPO | Admitting: Otolaryngology

## 2019-12-22 ENCOUNTER — Encounter: Payer: Self-pay | Admitting: Student

## 2019-12-22 ENCOUNTER — Other Ambulatory Visit: Payer: Self-pay

## 2019-12-22 ENCOUNTER — Ambulatory Visit (INDEPENDENT_AMBULATORY_CARE_PROVIDER_SITE_OTHER): Payer: BC Managed Care – PPO | Admitting: Student

## 2019-12-22 ENCOUNTER — Other Ambulatory Visit (HOSPITAL_COMMUNITY)
Admission: RE | Admit: 2019-12-22 | Discharge: 2019-12-22 | Disposition: A | Discharge status: Home / Self Care | Payer: BC Managed Care – PPO | Source: Ambulatory Visit | Attending: Student | Admitting: Student

## 2019-12-22 VITALS — BP 157/83 | HR 104 | Wt 116.1 lb

## 2019-12-22 DIAGNOSIS — Z124 Encounter for screening for malignant neoplasm of cervix: Secondary | ICD-10-CM | POA: Diagnosis present

## 2019-12-22 DIAGNOSIS — N898 Other specified noninflammatory disorders of vagina: Secondary | ICD-10-CM | POA: Diagnosis not present

## 2019-12-22 DIAGNOSIS — Z1331 Encounter for screening for depression: Secondary | ICD-10-CM | POA: Diagnosis not present

## 2019-12-22 DIAGNOSIS — Z1231 Encounter for screening mammogram for malignant neoplasm of breast: Secondary | ICD-10-CM

## 2019-12-22 NOTE — Progress Notes (Deleted)
mm

## 2019-12-23 LAB — CYTOLOGY - PAP
Comment: NEGATIVE
Diagnosis: NEGATIVE
High risk HPV: NEGATIVE

## 2019-12-23 LAB — CERVICOVAGINAL ANCILLARY ONLY
Bacterial Vaginitis (gardnerella): NEGATIVE
Candida Glabrata: NEGATIVE
Candida Vaginitis: NEGATIVE
Comment: NEGATIVE
Comment: NEGATIVE
Comment: NEGATIVE

## 2019-12-23 NOTE — Progress Notes (Signed)
History:  [name] is a 54 y.o. No obstetric history on file. who presents to clinic today for repeat pap and limited well-woman exam. Patient defers breast exam and speculum/bimanual due to vulvodynia.  She was last seen in clinic in 2020.  Last pap was 2019 and was normal.  She requests blind pap, with the knowledge that we may have to repeat pap if not enough cells are detected.    She reports significant anxiety around taking care of her parents, who are elderly. She has been trying to get her parents vaccinated, as well as herself. She lives with her parents and her daughter. She is tearful about the stresses of the pandemic, but declines to see Roselyn Reef today. She reports that she is a [place] and would never hurt herself as she has her family to live for.  The following portions of the patient's history were reviewed and updated as appropriate: allergies, current medications, family history, past medical history, social history, past surgical history and problem list.  Review of Systems:  Review of Systems  Constitutional: Negative.   HENT: Negative.   Respiratory: Negative.   Cardiovascular: Negative.   Genitourinary: Negative.   Musculoskeletal: Negative.   Skin: Negative.   Neurological: Negative.   Psychiatric/Behavioral: Negative.       Objective:  Physical Exam BP (!) 157/83   Pulse (!) 104   Wt 116 lb 1.6 oz (52.7 kg)   LMP 10/11/2019 (Approximate)   BMI 22.67 kg/m  Physical Exam  Constitutional: She is oriented to person, place, and time. She appears well-developed and well-nourished.  HENT:  Head: Normocephalic.  Cardiovascular: Normal rate.  Respiratory: Effort normal and breath sounds normal.  GI: Soft.  Genitourinary:    Genitourinary Comments: NEFG; no lesions on outer labia.    Musculoskeletal:        General: Normal range of motion.  Neurological: She is alert and oriented to person, place, and time. She has normal reflexes.  Skin: Skin is warm  and dry.      Labs and Imaging No results found for this or any previous visit (from the past 24 hour(s)).  No results found.   Assessment & Plan:  1. Positive depression screening Discussed with patient her elevated depression score; patient declined to see Roselyn Reef today (appt was made but patient canceled).  Engaged in 15 minutes of therapeutic listening and support, patient thanked me for the time and compassion.   - Ambulatory referral to [facility]  2. Pap smear for cervical cancer screening -Blind pap done - Cytology - PAP( [place])  3. Vaginal discharge  - Cervicovaginal ancillary only( [place])  4. Encounter for screening mammogram for breast cancer  - MM 3D SCREEN BREAST BILATERAL; Future   Starr Lake, CNM 12/23/2019 10:15 AM

## 2020-01-01 NOTE — BH Specialist Note (Deleted)
Integrated Behavioral Health via Telemedicine Video Visit  01/01/2020 Michele [name] [id number]  Number of [place] visits: 1 Session Start time: 1:45***  Session End time: 2:45*** Total time: {IBH Total [id number]  Referring Provider: Maye Hides, CNM Type of Visit: Video Patient/Family location: Home [facility] Provider location: [facility] All persons participating in visit: Patient *** and [name] ***   Confirmed patient's address: Yes  Confirmed patient's phone number: Yes  Any changes to demographics: No   Confirmed patient's insurance: Yes  Any changes to patient's insurance: No   Discussed confidentiality: Yes ***  I connected with Michele [name]  by a video enabled telemedicine application and verified that I am speaking with the correct person using two identifiers.     I discussed the limitations of evaluation and management by telemedicine and the availability of in person appointments.  I discussed that the purpose of this visit is to provide behavioral health care while limiting exposure to the novel coronavirus.   Discussed there is a possibility of technology failure and discussed alternative modes of communication if that failure occurs.  I discussed that engaging in this video visit, they consent to the provision of behavioral healthcare and the services will be billed under their insurance.  Patient and/or legal guardian expressed understanding and consented to video visit: Yes   PRESENTING CONCERNS: Patient and/or family reports the following symptoms/concerns: *** Duration of problem: ***; Severity of problem: {Mild/Moderate/Severe:20260}  STRENGTHS (Protective Factors/Coping Skills): ***  GOALS ADDRESSED: Patient will: 1.  Reduce symptoms of: {IBH Symptoms:21014056}  2.  Increase knowledge and/or ability of: {IBH Patient Tools:21014057}  3.  Demonstrate ability to: {IBH Goals:21014053}  INTERVENTIONS: Interventions utilized:   {IBH Interventions:21014054} Standardized Assessments completed: {IBH Screening Tools:21014051}  ASSESSMENT: Patient currently experiencing ***.   Patient may benefit from psychoeducation and brief therapeutic interventions regarding coping with symptoms of *** .  PLAN: 1. Follow up with behavioral health clinician on : *** 2. Behavioral recommendations:  -*** -*** 3. Referral(s): {IBH Referrals:21014055}  I discussed the assessment and treatment plan with the patient and/or parent/guardian. They were provided an opportunity to ask questions and all were answered. They agreed with the plan and demonstrated an understanding of the instructions.   They were advised to call back or seek an in-person evaluation if the symptoms worsen or if the condition fails to improve as anticipated.  Caroleen Hamman [name]

## 2020-01-05 ENCOUNTER — Institutional Professional Consult (permissible substitution): Payer: BC Managed Care – PPO

## 2020-01-26 ENCOUNTER — Encounter: Payer: Self-pay | Admitting: *Deleted

## 2020-01-28 ENCOUNTER — Ambulatory Visit: Payer: BC Managed Care – PPO | Attending: Internal Medicine

## 2020-01-28 DIAGNOSIS — Z23 Encounter for immunization: Secondary | ICD-10-CM | POA: Insufficient documentation

## 2020-01-28 NOTE — Progress Notes (Signed)
   Covid-19 Vaccination Clinic  Name:  Michele [name]    MRN: [id number] DOB: 06-24-1966  01/28/2020  Michele [name] was observed post Covid-19 immunization for 15 minutes without incident. She was provided with Vaccine Information Sheet and instruction to access the V-Safe system.   Michele [name] was instructed to call 911 with any severe reactions post vaccine: Marland Kitchen Difficulty breathing  . Swelling of face and throat  . A fast heartbeat  . A bad rash all over body  . Dizziness and weakness   Immunizations Administered    Name Date Dose VIS Date Route   Pfizer COVID-19 Vaccine 01/28/2020 11:39 AM 0.3 mL 11/06/2019 Intramuscular   Manufacturer: [place]   Lot: [id number]   [place]: [id number]

## 2020-02-17 ENCOUNTER — Ambulatory Visit: Payer: BC Managed Care – PPO

## 2020-02-17 ENCOUNTER — Ambulatory Visit: Payer: Self-pay

## 2020-02-20 ENCOUNTER — Ambulatory Visit: Payer: Self-pay

## 2020-02-22 ENCOUNTER — Ambulatory Visit: Payer: Self-pay

## 2020-02-23 ENCOUNTER — Ambulatory Visit: Payer: Self-pay

## 2020-02-24 ENCOUNTER — Ambulatory Visit: Payer: BC Managed Care – PPO | Attending: Internal Medicine

## 2020-02-24 DIAGNOSIS — Z23 Encounter for immunization: Secondary | ICD-10-CM

## 2020-02-24 NOTE — Progress Notes (Signed)
   Covid-19 Vaccination Clinic  Name:  Michele [name]    MRN: [id number] DOB: 10-Oct-1966  02/24/2020  Michele [name] was observed post Covid-19 immunization for 15 minutes without incident. She was provided with Vaccine Information Sheet and instruction to access the V-Safe system.   Michele [name] was instructed to call 911 with any severe reactions post vaccine: Marland Kitchen Difficulty breathing  . Swelling of face and throat  . A fast heartbeat  . A bad rash all over body  . Dizziness and weakness   Immunizations Administered    Name Date Dose VIS Date Route   Pfizer COVID-19 Vaccine 02/24/2020 11:59 AM 0.3 mL 11/06/2019 Intramuscular   Manufacturer: [employer]   Lot: [id number]   [place]: [id number]

## 2020-03-02 ENCOUNTER — Ambulatory Visit: Payer: BC Managed Care – PPO

## 2021-04-03 ENCOUNTER — Other Ambulatory Visit: Payer: Self-pay | Admitting: Family Medicine

## 2021-04-03 DIAGNOSIS — Z1231 Encounter for screening mammogram for malignant neoplasm of breast: Secondary | ICD-10-CM

## 2021-05-26 ENCOUNTER — Ambulatory Visit: Payer: BC Managed Care – PPO

## 2021-06-01 ENCOUNTER — Emergency Department (HOSPITAL_BASED_OUTPATIENT_CLINIC_OR_DEPARTMENT_OTHER)
Admission: EM | Admit: 2021-06-01 | Discharge: 2021-06-01 | Disposition: A | Discharge status: Home / Self Care | Payer: BC Managed Care – PPO | Attending: Emergency Medicine | Admitting: Emergency Medicine

## 2021-06-01 ENCOUNTER — Other Ambulatory Visit: Payer: Self-pay

## 2021-06-01 DIAGNOSIS — U071 COVID-19: Secondary | ICD-10-CM | POA: Diagnosis not present

## 2021-06-01 DIAGNOSIS — E860 Dehydration: Secondary | ICD-10-CM | POA: Insufficient documentation

## 2021-06-01 DIAGNOSIS — R110 Nausea: Secondary | ICD-10-CM | POA: Diagnosis present

## 2021-06-01 DIAGNOSIS — R682 Dry mouth, unspecified: Secondary | ICD-10-CM

## 2021-06-01 LAB — URINALYSIS, ROUTINE W REFLEX MICROSCOPIC
Bilirubin Urine: NEGATIVE
Glucose, UA: NEGATIVE mg/dL
Hgb urine dipstick: NEGATIVE
Ketones, ur: NEGATIVE mg/dL
Leukocytes,Ua: NEGATIVE
Nitrite: NEGATIVE
Protein, ur: NEGATIVE mg/dL
Specific Gravity, Urine: <1.005 — ABNORMAL LOW (ref 1.005–1.030)
pH: 7 (ref 5.0–8.0)

## 2021-06-01 LAB — CBC
HCT: 34.9 % — ABNORMAL LOW (ref 36.0–46.0)
Hemoglobin: 13.1 g/dL (ref 12.0–15.0)
MCH: 32.2 pg (ref 26.0–34.0)
MCHC: 37.5 g/dL — ABNORMAL HIGH (ref 30.0–36.0)
MCV: 85.7 fL (ref 80.0–100.0)
Platelets: 213 10*3/uL (ref 150–400)
RBC: 4.07 MIL/uL (ref 3.87–5.11)
RDW: 12.1 % (ref 11.5–15.5)
WBC: 2.6 10*3/uL — ABNORMAL LOW (ref 4.0–10.5)
nRBC: 0 % (ref 0.0–0.2)

## 2021-06-01 LAB — PREGNANCY, URINE: Preg Test, Ur: NEGATIVE

## 2021-06-01 MED ORDER — ONDANSETRON HCL 4 MG/2ML IJ SOLN
4.0000 mg | Freq: Once | INTRAMUSCULAR | Status: AC
  Administered 2021-06-01: 4 mg via INTRAVENOUS
  Filled 2021-06-01: qty 2

## 2021-06-01 MED ORDER — LORAZEPAM 2 MG/ML IJ SOLN
1.0000 mg | Freq: Once | INTRAMUSCULAR | Status: AC
  Administered 2021-06-01: 1 mg via INTRAVENOUS
  Filled 2021-06-01: qty 1

## 2021-06-01 MED ORDER — SODIUM CHLORIDE 0.9 % IV BOLUS
1000.0000 mL | Freq: Once | INTRAVENOUS | Status: AC
  Administered 2021-06-01: 1000 mL via INTRAVENOUS

## 2021-06-01 NOTE — ED Notes (Signed)
Patient requesting something for anxiety. Dr. Dina Rich notifed, see [facility]

## 2021-06-01 NOTE — ED Notes (Signed)
Pt stated that that feels dehydrated and is requesting fluids. MD notified

## 2021-06-01 NOTE — ED Provider Notes (Signed)
[place] EMERGENCY DEPT Provider Note   CSN: [id number] Arrival date & time: 06/01/21  1738     History Chief Complaint  Patient presents with   Shortness of Breath   Weakness    Michele [name] is a 55 y.o. female.  HPI  55 year old female with past medical history of anxiety, depression, chronic pain presents to the emergency department with concern for nausea/vomiting and feeling dehydrated.  Patient is COVID-positive as of 6 days ago per home test.  Patient is complaining of dry mouth, dry throat and nausea with dehydration.  Denies any chest pain, shortness of breath, cough, high fevers, diarrhea, genitourinary symptoms.  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     Family History  Problem Relation Age of Onset   Hypertension Mother    Diabetes Father     Social History   Tobacco Use   Smoking status: Never   Smokeless tobacco: Never  Vaping Use   Vaping Use: Never used  Substance Use Topics   Alcohol use: No   Drug use: No    Home Medications Prior to Admission medications   Medication Sig Start Date End Date Taking? Authorizing Provider  buPROPion (WELLBUTRIN SR) 100 MG 12 hr tablet Take 1 tablet (100 mg total) by mouth 2 (two) times daily. 08/01/17   Aundra Dubin, MD  fluconazole  (DIFLUCAN) 100 MG tablet Take 100 mg by mouth daily.  05/20/17   [provider]  fluticasone (FLONASE) 50 MCG/ACT nasal spray USE 2 SPRAYS IN EACH NOSTRIL DAILY prn for congestion 05/02/17   [provider]  gabapentin (NEURONTIN) 300 MG capsule Take 600 mg by mouth 6 (six) times daily.     [provider]  hydrochlorothiazide (HYDRODIURIL) 25 MG tablet Take 25 mg by mouth daily.     [provider]  lidocaine (XYLOCAINE) 5 % ointment APPLY topically to affected area EVERY 6 HOURS AS NEEDED for pain 05/22/17   [provider]  LORazepam (ATIVAN) 2 MG tablet Take 2 mg by mouth every 8 (eight) hours as needed for anxiety.    [provider]  magnesium gluconate (MAGONATE) 500 MG tablet Take 500 mg by mouth daily.    [provider]  Multiple Vitamin (MULTIVITAMIN) tablet Take 1 tablet by mouth daily.    [provider]  oxyCODONE-acetaminophen (PERCOCET) 10-325 MG tablet Take 1 tablet by mouth daily as needed for pain.    [provider]  Probiotic Product (SOLUBLE FIBER/PROBIOTICS PO) Take 1 tablet by mouth daily.     [provider]  traMADol (ULTRAM) 50 MG tablet Take 100 mg by mouth every 6 (six) hours as needed.     [provider]  tretinoin (RETIN-A) 0.1 %  cream Apply topically at bedtime.    [provider]    Allergies    Methyldopa  Review of Systems   Review of Systems  Constitutional:  Positive for appetite change and fatigue. Negative for chills and fever.  HENT:  Negative for congestion.   Eyes:  Negative for visual disturbance.  Respiratory:  Negative for cough and shortness of breath.   Cardiovascular:  Negative for chest pain, palpitations and leg swelling.  Gastrointestinal:  Positive for nausea. Negative for abdominal pain, diarrhea and vomiting.  Endocrine:       + Dehydration  Genitourinary:  Negative for dysuria.  Skin:  Negative for rash.  Neurological:  Negative  for headaches.   Physical Exam Updated Vital Signs BP (!) 143/97 (BP Location: Right Arm)   Pulse 78   Temp 98.5 F (36.9 C) (Oral)   Resp (!) 22   Ht 5' (1.524 m)   Wt 52.2 kg   SpO2 100%   BMI 22.46 kg/m   Physical Exam Vitals and nursing note reviewed.  Constitutional:      Appearance: Normal appearance. She is not ill-appearing or diaphoretic.  HENT:     Head: Normocephalic.     Mouth/Throat:     Mouth: Mucous membranes are moist.     Pharynx: Oropharynx is clear. No pharyngeal swelling.  Cardiovascular:     Rate and Rhythm: Normal rate.  Pulmonary:     Effort: Pulmonary effort is normal. No respiratory distress.     Breath sounds: No stridor.  Abdominal:     Palpations: Abdomen is soft.     Tenderness: There is no abdominal tenderness.  Musculoskeletal:     Cervical back: Neck supple.     Right lower leg: No edema.     Left lower leg: No edema.  Skin:    General: Skin is warm.  Neurological:     Mental Status: She is alert and oriented to person, place, and time. Mental status is at baseline.  Psychiatric:        Mood and Affect: Mood is anxious.    ED Results / Procedures / Treatments   Labs (all labs ordered are listed, but only abnormal results are displayed) Labs Reviewed  CBC - Abnormal; Notable for the following components:      Result Value   WBC 2.6 (*)    HCT 34.9 (*)    MCHC 37.5 (*)    All other components within normal limits  URINALYSIS, ROUTINE W REFLEX MICROSCOPIC - Abnormal; Notable for the following components:   Color, Urine COLORLESS (*)    Specific Gravity, Urine <1.005 (*)    All other components within normal limits  PREGNANCY, URINE  COMPREHENSIVE METABOLIC PANEL    EKG None  Radiology No results found.  Procedures Procedures   Medications Ordered in ED Medications  ondansetron (ZOFRAN) injection 4 mg (4 mg Intravenous Given 06/01/21 1852)  sodium chloride 0.9 % bolus 1,000 mL (1,000 mLs Intravenous New Bag/Given  06/01/21 1949)  LORazepam (ATIVAN) injection 1 mg (1 mg Intravenous Given 06/01/21 1956)    ED Course  I have reviewed the triage vital signs and the nursing notes.  Pertinent labs & imaging results that were available during my care of the patient were reviewed by me and considered in my medical decision making (see chart for details).    MDM Rules/Calculators/A&P  55 year old female presents the emergency department concern for dry mouth and dry throat with dehydration.  No evidence of angioedema, no drooling, she is swallowing without difficulty, airway is patent.  Oral exam is unremarkable.  She is COVID-positive about a week ago, has been mainly experiencing nausea.  She endorses generalized weakness.  She denies any chest pain, cough, shortness of breath.  No difficulty swallowing.  Patient was very anxious and tachycardic in triage.  This resolved without any intervention in the exam room.  Initial EKG was sinus tachycardia with artifact, repeat is normal sinus rhythm with no acute ischemic changes.  Blood work shows no leukocytosis, urinalysis is unremarkable, pregnancy test is negative.  Patient was treated with Zofran and IV fluids.  She also requested a home dose of her Ativan which she was given.  On reevaluation she feels improved but still complains of dry mouth.  Continues to deny any chest pain, shortness of breath or cough.  No concern for ACS/PE at this time with the absence of symptoms.  Patient was requesting to be discharged to be able to go home and take her home medications prior to [facility] resulting.  Low suspicion for acute electrolyte abnormality/AKI given her urinalysis, normal vitals.  She was hydrated, she is out of the window for acute treatment for COVID.  Otherwise is well-appearing and able to p.o.  Patient will be discharged and treated as an outpatient.  Discharge plan and strict return to ED precautions discussed, patient verbalizes understanding  and agreement.   Final Clinical Impression(s) / ED Diagnoses Final diagnoses:  None    Rx / DC Orders ED Discharge Orders     None        Lorelle Gibbs, DO 06/01/21 2337

## 2021-06-01 NOTE — Discharge Instructions (Addendum)
You have been seen and discharged from the emergency department.  Your blood work was normal.  Follow-up with your primary provider for reevaluation and further care. Take home medications as prescribed. If you have any worsening symptoms or further concerns for your health please return to an emergency department for further evaluation. 

## 2021-06-01 NOTE — ED Triage Notes (Signed)
Pt presents to ED from home C/O vomiting, dry mouth, generalized weakness since 7/1. Pt reports testing positive for COVID 7/1.

## 2021-08-22 ENCOUNTER — Ambulatory Visit: Payer: BC Managed Care – PPO | Admitting: Neurology

## 2021-09-19 ENCOUNTER — Encounter: Payer: Self-pay | Admitting: *Deleted

## 2021-09-22 ENCOUNTER — Encounter: Payer: Self-pay | Admitting: Diagnostic Neuroimaging

## 2021-09-22 ENCOUNTER — Ambulatory Visit (INDEPENDENT_AMBULATORY_CARE_PROVIDER_SITE_OTHER): Payer: BC Managed Care – PPO | Admitting: Diagnostic Neuroimaging

## 2021-09-22 VITALS — BP 112/82 | HR 91 | Ht 60.0 in | Wt 118.0 lb

## 2021-09-22 DIAGNOSIS — G8929 Other chronic pain: Secondary | ICD-10-CM | POA: Diagnosis not present

## 2021-09-22 DIAGNOSIS — M79672 Pain in left foot: Secondary | ICD-10-CM

## 2021-09-22 DIAGNOSIS — M79671 Pain in right foot: Secondary | ICD-10-CM

## 2021-09-22 NOTE — Progress Notes (Signed)
[facility]  PATIENT: Michele [name] DOB: Jul 24, 1966  REFERRING CLINICIAN: Inocencio Homes, DPM HISTORY FROM: patient  REASON FOR VISIT: new consult    HISTORICAL  CHIEF COMPLAINT:  Chief Complaint  Patient presents with   Follow-up    Rm 6 alone here for consult on worsening foot pain- pt reports she has has several injections have been tired but has not helped much. Pt reports sx have been going on for a few years now, sx are equal in both.    HISTORY OF PRESENT ILLNESS:   55 year old female noticed bilateral foot pain.  Patient has chronic pain in her feet low back for several years.  She has been under pain management with Dr. Nelva Bush and Dr. Royce Macadamia.  She saw podiatrist for evaluation of bilateral heel and plantar pain.  She is diagnosed with plantar fasciitis and treated with injections and foot inserts.  Symptoms are worse early in the morning and when she walks or goes up steps.  She has shooting stabbing pain mainly in her heel but also forefoot.  No problems in the ankles, calves.  She has chronic knee pain, hip pain and back pain.  Currently patient is on pain management.  Sitting in exam room she does not have any numbness, tingling or pain in the feet currently.    REVIEW OF SYSTEMS: Full 14 system review of systems performed and negative with exception of: as per HPI.  ALLERGIES: Allergies  Allergen Reactions   Methyldopa Other (See Comments)    Jaundice    HOME MEDICATIONS: Outpatient Medications Prior to Visit  Medication Sig Dispense Refill   buPROPion (WELLBUTRIN SR) 100 MG 12 hr tablet Take 1 tablet (100 mg total) by mouth 2 (two) times daily. 60 tablet 2   fluconazole (DIFLUCAN) 100 MG tablet Take 100 mg by mouth daily.      FLUoxetine (PROZAC) 10 MG tablet Take 10 mg by mouth daily.     fluticasone (FLONASE) 50 MCG/ACT nasal spray USE 2 SPRAYS IN EACH NOSTRIL DAILY prn for congestion     furosemide (LASIX) 40 MG tablet Take 40 mg by mouth.      gabapentin (NEURONTIN) 300 MG capsule Take 600 mg by mouth 6 (six) times daily.      hydrochlorothiazide (HYDRODIURIL) 25 MG tablet Take 25 mg by mouth daily.      lidocaine (XYLOCAINE) 5 % ointment APPLY topically to affected area EVERY 6 HOURS AS NEEDED for pain     LORazepam (ATIVAN) 2 MG tablet Take 2 mg by mouth 3 (three) times daily.     magnesium gluconate (MAGONATE) 500 MG tablet Take 500 mg by mouth daily.     Multiple Vitamin (MULTIVITAMIN) tablet Take 1 tablet by mouth daily.     oxyCODONE-acetaminophen (PERCOCET) 10-325 MG tablet Take 1 tablet by mouth daily as needed for pain.     Probiotic Product (SOLUBLE FIBER/PROBIOTICS PO) Take 2 tablets by mouth daily.     traMADol (ULTRAM) 50 MG tablet Take 100 mg by mouth. 4 times per day     tretinoin (RETIN-A) 0.1 % cream Apply topically at bedtime.     No facility-administered medications prior to visit.    PAST MEDICAL HISTORY: Past Medical History:  Diagnosis Date   Arthralgia    Bursitis    Chronic pain    Depression    Plantar fasciitis    Vulvodynia     PAST SURGICAL HISTORY: Past Surgical History:  Procedure Laterality Date   BREAST  ENHANCEMENT SURGERY     CESAREAN SECTION     SKIN SURGERY      FAMILY HISTORY: Family History  Problem Relation Age of Onset   Hypertension Mother    Diabetes Father     SOCIAL HISTORY: Social History   Socioeconomic History   Marital status: Single    Spouse name: Not on file   Number of children: Not on file   Years of education: Not on file   Highest education level: Bachelor's degree (e.g., BA, AB, BS)  Occupational History   Not on file  Tobacco Use   Smoking status: Never   Smokeless tobacco: Never  Vaping Use   Vaping Use: Never used  Substance and Sexual Activity   Alcohol use: No   Drug use: No   Sexual activity: Never    Birth control/protection: None  Other Topics Concern   Not on file  Social History Narrative   Right handed   Lives at home with  mom   Some caffeine    Social Determinants of Health   Financial Resource Strain: Not on file  Food Insecurity: Not on file  Transportation Needs: Not on file  Physical Activity: Not on file  Stress: Not on file  Social Connections: Not on file  Intimate Partner Violence: Not on file     PHYSICAL EXAM  GENERAL EXAM/CONSTITUTIONAL: Vitals:  Vitals:   09/22/21 1119  BP: 112/82  Pulse: 91  SpO2: 99%  Weight: 118 lb (53.5 kg)  Height: 5' (1.524 m)   Body mass index is 23.05 kg/m. Wt Readings from Last 3 Encounters:  09/22/21 118 lb (53.5 kg)  06/01/21 115 lb (52.2 kg)  12/22/19 116 lb 1.6 oz (52.7 kg)   Patient is in no distress; well developed, nourished and groomed; neck is supple  CARDIOVASCULAR: Examination of carotid arteries is normal; no carotid bruits Regular rate and rhythm, no murmurs Examination of peripheral vascular system by observation and palpation is normal  EYES: Ophthalmoscopic exam of optic discs and posterior segments is normal; no papilledema or hemorrhages No results found.  MUSCULOSKELETAL: Gait, strength, tone, movements noted in Neurologic exam below  NEUROLOGIC: MENTAL STATUS:  No flowsheet data found. awake, alert, oriented to person, place and time recent and remote memory intact normal attention and concentration language fluent, comprehension intact, naming intact fund of knowledge appropriate  CRANIAL NERVE:  2nd - no papilledema on fundoscopic exam 2nd, 3rd, 4th, 6th - pupils equal and reactive to light, visual fields full to confrontation, extraocular muscles intact, no nystagmus 5th - facial sensation symmetric 7th - facial strength symmetric 8th - hearing intact 9th - palate elevates symmetrically, uvula midline 11th - shoulder shrug symmetric 12th - tongue protrusion midline  MOTOR:  normal bulk and tone, full strength in the BUE, BLE  SENSORY:  normal and symmetric to light touch, temperature,  vibration  COORDINATION:  finger-nose-finger, fine finger movements normal  REFLEXES:  deep tendon reflexes TRACE and symmetric  GAIT/STATION:  narrow based gait     DIAGNOSTIC DATA (LABS, IMAGING, TESTING) - I reviewed patient records, labs, notes, testing and imaging myself where available.  Lab Results  Component Value Date   WBC 2.6 (L) 06/01/2021   HGB 13.1 06/01/2021   HCT 34.9 (L) 06/01/2021   MCV 85.7 06/01/2021   PLT 213 06/01/2021      Component Value Date/Time   NA 133 (L) 07/09/2017 1420   K 3.1 (L) 07/09/2017 1420   CL 98 (L) 07/09/2017  1420   CO2 27 07/09/2017 1420   GLUCOSE 100 (H) 07/09/2017 1420   BUN 7 07/09/2017 1420   CREATININE 0.75 07/09/2017 1420   CALCIUM 9.3 07/09/2017 1420   PROT 7.0 07/09/2017 1420   ALBUMIN 4.4 07/09/2017 1420   AST 25 07/09/2017 1420   ALT 27 07/09/2017 1420   ALKPHOS 30 (L) 07/09/2017 1420   BILITOT 0.4 07/09/2017 1420   GFRNONAA >60 07/09/2017 1420   GFRAA >60 07/09/2017 1420   Lab Results  Component Value Date   CHOL 229 (H) 01/02/2010   HDL 75 01/02/2010   LDLCALC 138 (H) 01/02/2010   TRIG 81 01/02/2010   CHOLHDL 3.1 Ratio 01/02/2010   No results found for: HGBA1C No results found for: VITAMINB12 Lab Results  Component Value Date   TSH 1.148 04/22/2009       ASSESSMENT AND PLAN  55 y.o. year old female here with bilateral plantar foot and heel pain, worse in the early morning, worse with standing and walking.   Dx:  1. Chronic pain of both feet      PLAN:  BILATERAL HEEL / PLANTAR FOOT pain - likely plantar fasciitits (no major signs of neuropathy or lumbar radiculopathies at this time) - continue pain meds; consider foot inserts / orthotics, exercises  Return for pending if symptoms worsen or fail to improve, pending test results.    Penni Bombard, MD 62/56/3893, 73:42 AM Certified in Neurology, Neurophysiology and Neuroimaging  [facility] [address]  [place], [place] [address] [phone]

## 2021-09-22 NOTE — Patient Instructions (Signed)
  BILATERAL HEEL / PLANTAR FOOT pain - likely plantar fasciitits (no major signs of neuropathy or lumbar radiculopathies at this time) - continue pain meds; consider foot inserts / orthotics, exercises

## 2021-10-04 ENCOUNTER — Telehealth: Payer: Self-pay | Admitting: Diagnostic Neuroimaging

## 2021-10-04 NOTE — Telephone Encounter (Signed)
Medication list updated.

## 2021-10-04 NOTE — Telephone Encounter (Signed)
Pt called, reviewed office visit summary; oxyCODONE-acetaminophen (PERCOCET) 10-325 MG tablet dosage is incorrect. Correct dosage is 1 tablet twice a day.

## 2021-11-04 LAB — COLOGUARD: COLOGUARD: NEGATIVE

## 2021-11-04 LAB — EXTERNAL GENERIC LAB PROCEDURE: COLOGUARD: NEGATIVE

## 2022-01-11 ENCOUNTER — Other Ambulatory Visit: Payer: Self-pay

## 2022-01-11 ENCOUNTER — Ambulatory Visit: Payer: BC Managed Care – PPO

## 2022-01-11 ENCOUNTER — Ambulatory Visit
Admission: RE | Admit: 2022-01-11 | Discharge: 2022-01-11 | Disposition: A | Discharge status: Home / Self Care | Payer: BC Managed Care – PPO | Source: Ambulatory Visit | Attending: Family Medicine | Admitting: Family Medicine

## 2022-01-11 DIAGNOSIS — Z1231 Encounter for screening mammogram for malignant neoplasm of breast: Secondary | ICD-10-CM

## 2022-07-02 NOTE — Progress Notes (Unsigned)
NEW PATIENT Date of Service/Encounter:  07/04/22 Referring provider: Calvert Cantor, MD Primary care provider: [facility]  Subjective:  Michele [name] is a 56 y.o. female with a PMHx of hypercholesterolemia, anxiety, depression, recurrent UTIs, osteoma of skull presenting today for evaluation of allergic rhinitis and itching, and transfer of care from [place]. History obtained from: chart review and patient, mother, and sister.   Chronic rhinitis-allergic She is transferring care from [place] on AIT, but allergist left.  She is currently on allergy meds.  Taking mostly Benadryl as needed She was most recently tested in Sept 2022.   She states she is "allergic to everything". She has been off allergy injections for a few weeks due to a recent flare of itching.   She was given a steroid injection, and told to pause shots at that time. She was told by former allergist that the injections she was receiving were not mixed in house and possibly not as strong as other allergy mixes. She was told to ask about this at new allergy office. Prior to COVID, had mild dust mite allergy, but these seems to have amplified after receiving COVID-19 vaccine a few years ago.  A few weeks ago, she was cleaning out her closet and also her mom's clothes got mixed with hers in the wash, and she started having intense itchy.  She feels her home is contaminated because the dusty clothes were cleaned in the washing machine.    Her family is here today because they are concerned about her constant itching, and states that she has been sleeping without close due to fear of using sheets which have been washed in [place].  She is refusing to use her mother's washing machine as she feels there may be something in it that is perpetuating her symptoms.  She feels increased itching when walking into her mother's laundry room or home.  She feels increased itching when using clothes that she had washed at a  laundromat because of gain detergent use by others.  She is using a laundromat because she fears using her own washing machine, her mother's washing machine or her sister's washing machine stating that there may be something in them that is causing her itch to intensify. She does occasionally have a red bumpy rash with her itching. She has also noticed increased scalp itching. She feels triamcinolone is helpful.   She is on gabapentin and hydrocodone for pain syndrome, but had weaned herself down on these medications because of fear that the medications may be causing her itching to worsen.  She would like to update her allergy testing to see if she has developed a new allergy, but cannot do so today as she continues to take daily antihistamines.  She feels desperate and miserable.  She states feeling unheard by her family members.  Her family member state feeling concerned for her wellbeing.  Other allergy screening: Asthma: no Food allergy: no Eczema:no  Past Medical History: Past Medical History:  Diagnosis Date   Arthralgia    Bursitis    Chronic pain    Depression    Plantar fasciitis    Vulvodynia    Medication List:  Current Outpatient Medications  Medication Sig Dispense Refill   buPROPion (WELLBUTRIN SR) 100 MG 12 hr tablet Take 1 tablet (100 mg total) by mouth 2 (two) times daily. 60 tablet 2   fluconazole (DIFLUCAN) 100 MG tablet Take 100 mg by mouth daily.      Fluocinolone  Acetonide Body (DERMA-SMOOTHE/FS BODY) 0.01 % OIL Apply 1 Application topically 2 (two) times daily as needed. On scalp for itching or rash 118 mL 2   FLUoxetine (PROZAC) 10 MG tablet Take 10 mg by mouth daily.     fluticasone (FLONASE) 50 MCG/ACT nasal spray USE 2 SPRAYS IN EACH NOSTRIL DAILY prn for congestion     furosemide (LASIX) 40 MG tablet Take 40 mg by mouth.     gabapentin (NEURONTIN) 300 MG capsule Take 600 mg by mouth 6 (six) times daily.      hydrochlorothiazide (HYDRODIURIL) 25 MG  tablet Take 25 mg by mouth daily.      lidocaine (XYLOCAINE) 5 % ointment APPLY topically to affected area EVERY 6 HOURS AS NEEDED for pain     LORazepam (ATIVAN) 2 MG tablet Take 2 mg by mouth 3 (three) times daily.     magnesium gluconate (MAGONATE) 500 MG tablet Take 500 mg by mouth daily.     Multiple Vitamin (MULTIVITAMIN) tablet Take 1 tablet by mouth daily.     oxyCODONE-acetaminophen (PERCOCET) 10-325 MG tablet Take 1 tablet by mouth daily as needed for pain.     Probiotic Product (SOLUBLE FIBER/PROBIOTICS PO) Take 2 tablets by mouth daily.     traMADol (ULTRAM) 50 MG tablet Take 100 mg by mouth. 4 times per day     tretinoin (RETIN-A) 0.1 % cream Apply topically at bedtime.     No current facility-administered medications for this visit.   Known Allergies:  Allergies  Allergen Reactions   Buprenorphine     Other reaction(s): respiratory distress   Methyldopa Other (See Comments)    Jaundice   Other     Other reaction(s): Unknown   Past Surgical History: Past Surgical History:  Procedure Laterality Date   BREAST ENHANCEMENT SURGERY     CESAREAN SECTION     SKIN SURGERY     Family History: Family History  Problem Relation Age of Onset   Allergic rhinitis Mother    Hypertension Mother    Diabetes Father    Eczema Daughter    Social History: Michele [name] lives in a house full 29 years ago, no water damage, wood floors, gas heating, central AC, no pets, cockroaches seen at home during the night, not using dust mite protection on the bedding, no smoke exposure, no HEPA filter in the home.   ROS:  All other systems negative except as noted per HPI.  Objective:  Blood pressure 120/82, pulse 95, temperature 98.2 F (36.8 C), temperature source Temporal, resp. rate 16, height 4' 11.45" (1.51 m), weight 107 lb 3.2 oz (48.6 kg), SpO2 100 %. Body mass index is 21.33 kg/m. Physical Exam:  General Appearance:  Alert, cooperative, no distress, appears stated age  Head:   Normocephalic, without obvious abnormality, atraumatic  Eyes:  Conjunctiva clear, EOM's intact  Nose: Nares normal, hypertrophic turbinates, normal mucosa, and no visible anterior polyps  Throat: Lips, tongue normal; teeth and gums normal, normal posterior oropharynx  Neck: Supple, symmetrical  Lungs:   clear to auscultation bilaterally, Respirations unlabored, no coughing  Heart:  regular rate and rhythm and no murmur, Appears well perfused  Extremities: No edema  Skin: Skin color, texture, turgor normal, no rashes or lesions on visualized portions of skin  Neurologic: No gross deficits     Diagnostics: Skin Testing: Deferred due to recent antihistamines use.   Assessment and Plan  Oneda presents today as a transfer from [facility] allergy, previously on allergy  injections and would like to consider restarting. However, she has been suffering from chronic pruritus with intermittent rash, none present on exam today. Based on her history, suspect that some allergic exposure-possibly mold, possibly dust mite or cockroach antigen, caused her initial flare, and now she is dealing with neurogenic itch.  We discussed the importance of trying to break the itch scratch cycle.  I believe her symptoms have been compounded by decreasing her pain medications, particularly gabapentin, so I have discussed a gradual increase in this medication until she reaches her previous dose. We also focused on treating her symptoms and not focusing on a cause, as many times we do not find cause, and when we do, many allergens are an inevitable part of our surroundings so key is managing symptoms. Once H controlled, will obtain allergy testing to environmental allergens so that we can restart allergy injections.  Allergic Rhinitis:  - start Claritin 10 mg daily - continue flonase 1-2 sprays each nostril daily - repeat allergy testing once able to stop antihistamines for 3 days  Itching-multiple causes,  suspect allergic trigger and ongoing neurogenic cause:  Daily Care For Maintenance (daily and continue even once itching controlled) - Use hypoallergenic hydrating ointment at least twice daily.  This must be done daily for control of flares. (Great options include Vaseline, CeraVe, Aquaphor, Aveeno, Cetaphil, VaniCream, etc) - Avoid detergents, soaps or lotions with fragrances/dyes - Limit showers/baths to 5 minutes and use luke warm water instead of hot, pat dry following baths, and apply moisturizer - can use steroid/non-steroid therapy creams as detailed below up to twice weekly for prevention of flares.  For Flares:(add this to maintenance therapy if needed for flares) First apply steroid/non-steroid treatment creams. Wait 5 minutes then apply moisturizer.   - Triamcinolone 0.025% to body for moderate flares-apply topically twice daily to red, raised areas of skin, followed by moisturizer - Derma Smoothe/fluicinolone oil - can use twice daily on scalp as needed for itching - take claritin 10 mg twice daily-  Other options include: Xyzal (levocetirizine) 5 mg or Allegra (fexofenadine) 180 mg daily as needed - restart full dose of gabapentin -start Sarna lotion (over the counter anti-itch cream) use as many times as needed to help with itching Restart all pain medications as decreasing the dose has not improved your symptoms, and is not the cause of your itching.  Remember that finding a specific cause may not be possible, reframe your thinking to focus on treating symptoms and not looking for a cause.  Follow-up in 1 month, sooner if needed.    This note in its entirety was forwarded to the Provider who requested this consultation.  Thank you for your kind referral. I appreciate the opportunity to take part in Michele [name]'s care. Please do not hesitate to contact me with questions.  Sincerely,  Sigurd Sos, MD [facility]

## 2022-07-04 ENCOUNTER — Ambulatory Visit (INDEPENDENT_AMBULATORY_CARE_PROVIDER_SITE_OTHER): Payer: Medicare Other | Admitting: Internal Medicine

## 2022-07-04 ENCOUNTER — Encounter: Payer: Self-pay | Admitting: Internal Medicine

## 2022-07-04 VITALS — BP 120/82 | HR 95 | Temp 98.2°F | Resp 16 | Ht 59.45 in | Wt 107.2 lb

## 2022-07-04 DIAGNOSIS — L299 Pruritus, unspecified: Secondary | ICD-10-CM | POA: Diagnosis not present

## 2022-07-04 DIAGNOSIS — J302 Other seasonal allergic rhinitis: Secondary | ICD-10-CM

## 2022-07-04 DIAGNOSIS — J3089 Other allergic rhinitis: Secondary | ICD-10-CM

## 2022-07-04 DIAGNOSIS — R21 Rash and other nonspecific skin eruption: Secondary | ICD-10-CM | POA: Diagnosis not present

## 2022-07-04 MED ORDER — FLUOCINOLONE ACETONIDE BODY 0.01 % EX OIL: 1.0000 | TOPICAL_OIL | Freq: Two times a day (BID) | CUTANEOUS | 2 refills | Status: DC | PRN

## 2022-07-04 MED ORDER — TRIAMCINOLONE ACETONIDE 0.025 % EX OINT: 1.0000 | TOPICAL_OINTMENT | Freq: Two times a day (BID) | CUTANEOUS | 0 refills | Status: DC | PRN

## 2022-07-04 NOTE — Patient Instructions (Addendum)
Allergic Rhinitis:  - start Claritin 10 mg daily - continue flonase 1-2 sprays each nostril daily - repeat allergy testing once able to stop antihistamines for 3 days  Itching-multiple causes, suspect allergic trigger and ongoing neurogenic cause:  Daily Care For Maintenance (daily and continue even once itching controlled) - Use hypoallergenic hydrating ointment at least twice daily.  This must be done daily for control of flares. (Great options include Vaseline, CeraVe, Aquaphor, Aveeno, Cetaphil, VaniCream, etc) - Avoid detergents, soaps or lotions with fragrances/dyes - Limit showers/baths to 5 minutes and use luke warm water instead of hot, pat dry following baths, and apply moisturizer - can use steroid/non-steroid therapy creams as detailed below up to twice weekly for prevention of flares.  For Flares:(add this to maintenance therapy if needed for flares) First apply steroid/non-steroid treatment creams. Wait 5 minutes then apply moisturizer.   - Triamcinolone 0.025% to body for moderate flares-apply topically twice daily to red, raised areas of skin, followed by moisturizer - Derma Smoothe/fluicinolone oil - can use twice daily on scalp as needed for itching - take claritin 10 mg twice daily-  Other options include: Xyzal (levocetirizine) 5 mg or Allegra (fexofenadine) 180 mg daily as needed - restart full dose of gabapentin -start Sarna lotion (over the counter anti-itch cream) use as many times as needed to help with itching Restart all pain medications as decreasing the dose has not improved your symptoms, and is not the cause of your itching.  Remember that finding a specific cause may not be possible, reframe your thinking to focus on treating symptoms and not looking for a cause.  Follow-up in 1 month, sooner if needed.   If able to stop antihistamines for at least 3 days prior to this appointment, we will do allergy testing at follow-up.

## 2022-07-06 ENCOUNTER — Telehealth: Payer: Self-pay

## 2022-07-06 NOTE — Telephone Encounter (Signed)
Michele [name], Pharmacist/[facility] -called in DOB verified - requesting to verify/confirmed prescription directions written by provider for Fluocinolone Acetonide Body (Derma-Smoothe/FS Body) 0.01% Oil  Apply topically 2 (TWO) times daily as needed. On scalp for itching or rash dispense 171m RF 2  Reviewed with provider - advised ALevada Dythe directions/instructions are correct.

## 2022-08-03 NOTE — Progress Notes (Unsigned)
FOLLOW UP Date of Service/Encounter:  08/06/22   Subjective:  Michele [name] (DOB: Mar 28, 1966) is a 56 y.o. female who returns to the [facility] on 08/06/2022 in re-evaluation of the following: Chronic pruritus, allergic rhinitis previously on allergy shots at [place] obtained from: chart review and patient and mother.  For Review, LV was on 07/04/2022 with Dr.Hieu Herms seen for intial visit for establishing care, transfer from [place] .  Chronic rhinitis, awaiting repeat allergy testing to start allergy injections again and chronic pruritus felt neurogenic in nature.  Pertinent History/Diagnostics:  - Allergic Rhinitis:  transferring care from [facility] on AIT, but allergist left.  She is currently on allergy meds.  Taking mostly Benadryl as needed She was most recently tested in Sept 2022.   - SPT environmental panel-deferred at initial visit.  Due to antihistamine use - Chronic itching: occasionally have a red bumpy rash with her itching. She has also noticed increased scalp itching.  She feels triamcinolone is helpful.on gabapentin and hydrocodone for pain syndrome, but had weaned herself down on these medications because of fear that the medications may be causing her itching to worsen.  Today presents for follow-up. Her scalp is reacting to dust and perfumes.   Derma soothe oil did not help with itching.  She is now taking her full dose gabapentin-600 mg x 6 times a day.  Increasing this dose has helped with itching some.   Smells of chemicals are causing her to have intense scalp itching.   She has weaned down on some of her narcotics to see if this would help with itch, but is not noting much difference. She does feel that Sarna cream helps some with the itching as well. She has seen a dermatologist, and had her scalp evaluated.  She was told that her scalp looked healthy and patch testing was not recommended. She is here for allergy testing. She  does continue to have intense pain, followed by pain management. She does feel Claritin is helpful, but is unable to take 2 a day because it makes her mouth too dry due to a combination of the other medications that she is already taking.    Allergies as of 08/06/2022       Reactions   Buprenorphine    Other reaction(s): respiratory distress   Methyldopa Other (See Comments)   Jaundice   Other    Other reaction(s): Unknown        Medication List        Accurate as of August 06, 2022  5:13 PM. If you have any questions, ask your nurse or doctor.          STOP taking these medications    Fluocinolone Acetonide Body 0.01 % Oil Commonly known as: Derma-Smoothe/FS Body Stopped by: Clemon Chambers, MD   triamcinolone 0.025 % ointment Commonly known as: KENALOG Stopped by: Clemon Chambers, MD       TAKE these medications    buPROPion ER 100 MG 12 hr tablet Commonly known as: WELLBUTRIN SR Take 1 tablet (100 mg total) by mouth 2 (two) times daily.   fluconazole 100 MG tablet Commonly known as: DIFLUCAN Take 100 mg by mouth daily.   FLUoxetine 10 MG tablet Commonly known as: PROZAC Take 10 mg by mouth daily.   fluticasone 50 MCG/ACT nasal spray Commonly known as: FLONASE USE 2 SPRAYS IN EACH NOSTRIL DAILY prn for congestion   furosemide 40 MG tablet Commonly known as: LASIX  Take 40 mg by mouth.   gabapentin 300 MG capsule Commonly known as: NEURONTIN Take 600 mg by mouth 6 (six) times daily.   hydrochlorothiazide 25 MG tablet Commonly known as: HYDRODIURIL Take 25 mg by mouth daily.   lidocaine 5 % ointment Commonly known as: XYLOCAINE APPLY topically to affected area EVERY 6 HOURS AS NEEDED for pain   loratadine 10 MG tablet Commonly known as: CLARITIN Take 10 mg by mouth daily.   LORazepam 2 MG tablet Commonly known as: ATIVAN Take 2 mg by mouth 3 (three) times daily.   magnesium gluconate 500 MG tablet Commonly known as: MAGONATE Take 500  mg by mouth daily.   multivitamin tablet Take 1 tablet by mouth daily.   oxyCODONE-acetaminophen 10-325 MG tablet Commonly known as: PERCOCET Take 1 tablet by mouth daily as needed for pain.   Pataday 0.1 % ophthalmic solution Generic drug: olopatadine 1 drop 2 (two) times daily.   SOLUBLE FIBER/PROBIOTICS PO Take 2 tablets by mouth daily.   traMADol 50 MG tablet Commonly known as: ULTRAM Take 100 mg by mouth. 4 times per day   tretinoin 0.1 % cream Commonly known as: RETIN-A Apply topically at bedtime.   triamcinolone oint 0.025%-Cerave equivalent cream 1:1 mixture Apply topically 2 (two) times daily as needed. Started by: Clemon Chambers, MD       Past Medical History:  Diagnosis Date   Arthralgia    Bursitis    Chronic pain    Depression    Plantar fasciitis    Vulvodynia    Past Surgical History:  Procedure Laterality Date   BREAST ENHANCEMENT SURGERY     CESAREAN SECTION     SKIN SURGERY     Otherwise, there have been no changes to her past medical history, surgical history, family history, or social history.  ROS: All others negative except as noted per HPI.   Objective:  BP 120/72   Pulse 85   Temp 98.7 F (37.1 C) (Temporal)   Resp 16   SpO2 98%  There is no height or weight on file to calculate BMI. Physical Exam: General Appearance:  Alert, cooperative, no distress, appears stated age  Head:  Normocephalic, without obvious abnormality, atraumatic  Eyes:  Conjunctiva clear, EOM's intact  Nose: Nares normal,  mildly edematous turbinates, normal mucosa, and no visible anterior polyps  Throat: Lips, tongue normal; teeth and gums normal, normal posterior oropharynx  Neck: Supple, symmetrical  Lungs:   clear to auscultation bilaterally, Respirations unlabored, no coughing  Heart:  regular rate and rhythm and no murmur, Appears well perfused  Extremities: No edema  Skin: Skin color, texture, turgor normal, no rashes or lesions on visualized  portions of skin, scalp appears well, no signs of lice or scalp dermatitis  Neurologic: No gross deficits   Skin Testing: Environmental allergy panel. Adequate positive and negative controls Results discussed with patient/family.  [facility] Adult Perc - 08/06/22 1611     Time Antigen Placed 1610    Allergen Manufacturer Lavella Hammock    Location Back    Number of Test 59    1. Control-Buffer 50% Glycerol Negative    2. Control-Histamine 1 mg/ml 3+    3. Albumin saline Negative    4. [place] Negative    5. [place] Negative    6. [place] Negative    7. [place] Blue Negative    8. Meadow Fescue Negative    9. Perennial Rye Negative    10. Sweet Vernal Negative  11. Timothy Negative    12. Cocklebur Negative    13. Burweed Marshelder Negative    14. Ragweed, short Negative    15. Ragweed, Giant Negative    16. Plantain,  English Negative    17. Lamb's Quarters Negative    18. Sheep Sorrell Negative    19. Rough Pigweed Negative    20. Marsh Elder, Rough Negative    21. Mugwort, Common Negative    22. Ash mix Negative    23. Birch mix Negative    24. Beech American Negative    25. Box, Elder Negative    26. Cedar, red Negative    27. Cottonwood, [place] Negative    28. Elm mix Negative    29. Hickory Negative    30. Maple mix Negative    31. Oak, [place] mix Negative    32. Pecan Pollen Negative    33. Pine mix Negative    34. Sycamore Eastern Negative    35. Fairlawn, Black Pollen Negative    36. Alternaria alternata Negative    37. Cladosporium Herbarum Negative    38. Aspergillus mix Negative    39. Penicillium mix Negative    40. Bipolaris sorokiniana (Helminthosporium) Negative    41. Drechslera spicifera (Curvularia) Negative    42. Mucor plumbeus Negative    43. Fusarium moniliforme Negative    44. Aureobasidium pullulans (pullulara) Negative    45. Rhizopus oryzae Negative    46. Botrytis cinera Negative    47. Epicoccum nigrum Negative    48. Phoma betae Negative     49. Candida Albicans Negative    50. Trichophyton mentagrophytes Negative    51. Mite, D Farinae  5,000 AU/ml Negative    52. Mite, D Pteronyssinus  5,000 AU/ml Negative    53. Cat Hair 10,000 BAU/ml Negative    54.  Dog Epithelia Negative    55. Mixed Feathers Negative    56. Horse Epithelia Negative    57. Cockroach, German Negative    58. Mouse Negative    59. Tobacco Leaf Negative             Allergy testing results were read and interpreted by myself, documented by clinical staff.  Assessment/Plan   Chronic Rhinitis: Nonallergic - Claritin 10 mg daily - continue flonase 1-2 sprays each nostril daily -Repeat allergy testing today for environmental allergens was negative  Itching-multiple causes, suspect unknown trigger and ongoing neurogenic etiology- Daily Care For Maintenance (daily and continue even once itching controlled) - Use hypoallergenic hydrating ointment at least twice daily.  This must be done daily for control of flares. (Great options include Vaseline, CeraVe, Aquaphor, Aveeno, Cetaphil, VaniCream, etc) - Avoid detergents, soaps or lotions with fragrances/dyes - Limit showers/baths to 5 minutes and use luke warm water instead of hot, pat dry following baths, and apply moisturizer - can use steroid/non-steroid therapy creams as detailed below up to twice weekly for prevention of flares.  For Flares:(add this to maintenance therapy if needed for flares) First apply steroid/non-steroid treatment creams. Wait 5 minutes then apply moisturizer.   - Triamcinolone 0.025% to body for moderate flares-apply topically twice daily to red, raised areas of skin, followed by moisturizer - take claritin 10 mg daily-  Other options include: Xyzal (levocetirizine) 5 mg or Allegra (fexofenadine) 180 mg daily as needed -Continue gabapentin -Continue Sarna lotion (over the counter anti-itch cream) use as many times as needed to help with itching  Consider follow-up with  neurology -  Remember that finding a specific cause may not be possible, reframe your thinking to focus on treating symptoms and not looking for a cause.  Follow-up as needed  Sigurd Sos, MD  [facility]

## 2022-08-06 ENCOUNTER — Encounter: Payer: Self-pay | Admitting: Internal Medicine

## 2022-08-06 ENCOUNTER — Ambulatory Visit (INDEPENDENT_AMBULATORY_CARE_PROVIDER_SITE_OTHER): Payer: Medicare Other | Admitting: Internal Medicine

## 2022-08-06 VITALS — BP 120/72 | HR 85 | Temp 98.7°F | Resp 16

## 2022-08-06 DIAGNOSIS — L299 Pruritus, unspecified: Secondary | ICD-10-CM | POA: Insufficient documentation

## 2022-08-06 DIAGNOSIS — J302 Other seasonal allergic rhinitis: Secondary | ICD-10-CM

## 2022-08-06 DIAGNOSIS — J3089 Other allergic rhinitis: Secondary | ICD-10-CM

## 2022-08-06 DIAGNOSIS — J310 Chronic rhinitis: Secondary | ICD-10-CM

## 2022-08-06 MED ORDER — TRIAMCINOLONE ACETONIDE 0.025 % EX OINT: TOPICAL_OINTMENT | Freq: Two times a day (BID) | CUTANEOUS | 3 refills | Status: AC | PRN

## 2022-08-06 NOTE — Patient Instructions (Signed)
Chronic Rhinitis: Nonallergic - start Claritin 10 mg daily - continue flonase 1-2 sprays each nostril daily -Repeat allergy testing today for environmental allergens was negative  Itching-multiple causes, suspect unknown trigger and ongoing neurogenic etiology- Daily Care For Maintenance (daily and continue even once itching controlled) - Use hypoallergenic hydrating ointment at least twice daily.  This must be done daily for control of flares. (Great options include Vaseline, CeraVe, Aquaphor, Aveeno, Cetaphil, VaniCream, etc) - Avoid detergents, soaps or lotions with fragrances/dyes - Limit showers/baths to 5 minutes and use luke warm water instead of hot, pat dry following baths, and apply moisturizer - can use steroid/non-steroid therapy creams as detailed below up to twice weekly for prevention of flares.  For Flares:(add this to maintenance therapy if needed for flares) First apply steroid/non-steroid treatment creams. Wait 5 minutes then apply moisturizer.   - Triamcinolone 0.025% to body for moderate flares-apply topically twice daily to red, raised areas of skin, followed by moisturizer - take claritin 10 mg daily-  Other options include: Xyzal (levocetirizine) 5 mg or Allegra (fexofenadine) 180 mg daily as needed -Continue gabapentin -Continue Sarna lotion (over the counter anti-itch cream) use as many times as needed to help with itching    Remember that finding a specific cause may not be possible, reframe your thinking to focus on treating symptoms and not looking for a cause.  Follow-up as needed

## 2022-08-13 ENCOUNTER — Other Ambulatory Visit: Payer: Self-pay | Admitting: *Deleted

## 2022-08-13 ENCOUNTER — Telehealth: Payer: Self-pay

## 2022-08-13 ENCOUNTER — Telehealth: Payer: Self-pay | Admitting: *Deleted

## 2022-08-13 MED ORDER — TRIAMCINOLONE ACETONIDE 0.025 % EX OINT: 1.0000 | TOPICAL_OINTMENT | Freq: Two times a day (BID) | CUTANEOUS | 5 refills | Status: DC

## 2022-08-13 NOTE — Telephone Encounter (Signed)
New prescription has been sent in.

## 2022-08-13 NOTE — Telephone Encounter (Signed)
Received a fax from pharmacy stating that the Triamcinolone 0.025%-Cerave equivalent cream 1:1 is not covered compounded, and to please send in separate Rx for the individual ingredient, however I didn't see it mention this way in your discharge instruction and I just wanted to double check if this was sent in correctly to begin with.

## 2022-08-13 NOTE — Addendum Note (Signed)
Addended by: Eloy End D on: 08/13/2022 02:09 PM   Modules accepted: Orders

## 2022-08-13 NOTE — Telephone Encounter (Signed)
Same strength 0.025%

## 2022-08-13 NOTE — Telephone Encounter (Signed)
Michele [name] from the front desk states that the patient called back stating that she wants the cream instead of the ointment. She does not want the ointment at all. Is it ok to change the script to cream instead?

## 2022-08-13 NOTE — Telephone Encounter (Signed)
Prescription send into the pharmacy on file.

## 2022-08-13 NOTE — Telephone Encounter (Signed)
Patient called in and stated that the insurance will not cover triamcinolone oint 0.025%-Cerave equivalent cream... so could you please send in the triamcinolone ointment into the pharmacy as a separate prescription please and thank you.

## 2022-08-13 NOTE — Telephone Encounter (Signed)
Hi Michele [name]-I couldn't find triamcinolone 0.025% stand alone which is what she asked for.  If you can find it that way, great.Marland KitchenMarland KitchenIf not we could send in hydrocort 2.5% and explain that the other was not covered.

## 2022-08-13 NOTE — Telephone Encounter (Signed)
Hi Michele [name].  I believe I responded to a message earlier from Michele [name].  I could not find it as a separate prescription, so if you are able to find it and send that would be great.  Otherwise, we can send hydrocortisone 2.5% and let her know that the other one was not covered.

## 2022-08-14 MED ORDER — TRIAMCINOLONE ACETONIDE 0.025 % EX CREA: 1.0000 | TOPICAL_CREAM | Freq: Two times a day (BID) | CUTANEOUS | 5 refills | Status: DC

## 2022-08-14 NOTE — Addendum Note (Signed)
Addended by: Eloy End D on: 08/14/2022 05:00 PM   Modules accepted: Orders

## 2022-11-02 ENCOUNTER — Ambulatory Visit: Payer: Medicare Other | Admitting: Internal Medicine

## 2023-01-10 ENCOUNTER — Other Ambulatory Visit (HOSPITAL_BASED_OUTPATIENT_CLINIC_OR_DEPARTMENT_OTHER): Payer: Self-pay

## 2023-01-10 MED ORDER — BUPROPION HCL ER (SR) 100 MG PO TB12
100.0000 mg | ORAL_TABLET | Freq: Two times a day (BID) | ORAL | 2 refills | Status: AC
  Filled 2023-01-10: qty 180, 90d supply, fill #0
  Filled 2023-01-11: qty 60, 30d supply, fill #0
  Filled 2023-01-15 (×2): qty 30, 15d supply, fill #0
  Filled 2023-01-22: qty 180, 90d supply, fill #1
  Filled 2023-04-16: qty 120, 60d supply, fill #2

## 2023-01-11 ENCOUNTER — Other Ambulatory Visit (HOSPITAL_BASED_OUTPATIENT_CLINIC_OR_DEPARTMENT_OTHER): Payer: Self-pay

## 2023-01-14 ENCOUNTER — Other Ambulatory Visit (HOSPITAL_BASED_OUTPATIENT_CLINIC_OR_DEPARTMENT_OTHER): Payer: Self-pay

## 2023-01-15 ENCOUNTER — Other Ambulatory Visit: Payer: Self-pay

## 2023-01-15 ENCOUNTER — Other Ambulatory Visit (HOSPITAL_BASED_OUTPATIENT_CLINIC_OR_DEPARTMENT_OTHER): Payer: Self-pay

## 2023-01-15 ENCOUNTER — Other Ambulatory Visit (HOSPITAL_COMMUNITY): Payer: Self-pay

## 2023-01-22 ENCOUNTER — Other Ambulatory Visit (HOSPITAL_BASED_OUTPATIENT_CLINIC_OR_DEPARTMENT_OTHER): Payer: Self-pay

## 2023-04-16 ENCOUNTER — Other Ambulatory Visit (HOSPITAL_BASED_OUTPATIENT_CLINIC_OR_DEPARTMENT_OTHER): Payer: Self-pay

## 2023-04-17 ENCOUNTER — Other Ambulatory Visit (HOSPITAL_BASED_OUTPATIENT_CLINIC_OR_DEPARTMENT_OTHER): Payer: Self-pay

## 2023-04-23 ENCOUNTER — Other Ambulatory Visit: Payer: Self-pay

## 2023-06-22 ENCOUNTER — Other Ambulatory Visit (HOSPITAL_BASED_OUTPATIENT_CLINIC_OR_DEPARTMENT_OTHER): Payer: Self-pay

## 2023-07-27 ENCOUNTER — Other Ambulatory Visit (HOSPITAL_BASED_OUTPATIENT_CLINIC_OR_DEPARTMENT_OTHER): Payer: Self-pay

## 2023-08-01 ENCOUNTER — Other Ambulatory Visit (HOSPITAL_BASED_OUTPATIENT_CLINIC_OR_DEPARTMENT_OTHER): Payer: Self-pay

## 2023-08-01 MED ORDER — LORAZEPAM 2 MG PO TABS
2.0000 mg | ORAL_TABLET | Freq: Three times a day (TID) | ORAL | 0 refills | Status: DC
  Filled 2023-08-03: qty 6, 2d supply, fill #0
  Filled 2023-08-05: qty 84, 28d supply, fill #1

## 2023-08-02 ENCOUNTER — Other Ambulatory Visit: Payer: Self-pay

## 2023-08-02 ENCOUNTER — Other Ambulatory Visit (HOSPITAL_BASED_OUTPATIENT_CLINIC_OR_DEPARTMENT_OTHER): Payer: Self-pay

## 2023-08-02 ENCOUNTER — Other Ambulatory Visit (HOSPITAL_COMMUNITY): Payer: Self-pay

## 2023-08-03 ENCOUNTER — Other Ambulatory Visit (HOSPITAL_BASED_OUTPATIENT_CLINIC_OR_DEPARTMENT_OTHER): Payer: Self-pay

## 2023-08-05 ENCOUNTER — Other Ambulatory Visit (HOSPITAL_BASED_OUTPATIENT_CLINIC_OR_DEPARTMENT_OTHER): Payer: Self-pay

## 2023-08-15 ENCOUNTER — Other Ambulatory Visit (HOSPITAL_BASED_OUTPATIENT_CLINIC_OR_DEPARTMENT_OTHER): Payer: Self-pay

## 2023-08-15 ENCOUNTER — Other Ambulatory Visit: Payer: Self-pay

## 2023-08-15 ENCOUNTER — Other Ambulatory Visit (HOSPITAL_COMMUNITY): Payer: Self-pay

## 2023-08-16 ENCOUNTER — Other Ambulatory Visit (HOSPITAL_BASED_OUTPATIENT_CLINIC_OR_DEPARTMENT_OTHER): Payer: Self-pay

## 2023-08-16 ENCOUNTER — Other Ambulatory Visit: Payer: Self-pay

## 2023-08-16 MED ORDER — TRETINOIN 0.1 % EX CREA
TOPICAL_CREAM | CUTANEOUS | 2 refills | Status: AC | PRN
  Filled 2023-08-16: qty 45, 90d supply, fill #0
  Filled 2023-08-16 (×2): qty 45, 30d supply, fill #0

## 2023-08-21 ENCOUNTER — Other Ambulatory Visit (HOSPITAL_BASED_OUTPATIENT_CLINIC_OR_DEPARTMENT_OTHER): Payer: Self-pay

## 2023-08-23 ENCOUNTER — Other Ambulatory Visit (HOSPITAL_BASED_OUTPATIENT_CLINIC_OR_DEPARTMENT_OTHER): Payer: Self-pay

## 2023-08-23 MED ORDER — TRETINOIN 0.1 % EX CREA
1.0000 | TOPICAL_CREAM | CUTANEOUS | 6 refills | Status: AC | PRN
  Filled 2023-08-23: qty 45, 30d supply, fill #0

## 2023-08-24 ENCOUNTER — Other Ambulatory Visit (HOSPITAL_BASED_OUTPATIENT_CLINIC_OR_DEPARTMENT_OTHER): Payer: Self-pay

## 2023-08-24 MED ORDER — LORAZEPAM 2 MG PO TABS
2.0000 mg | ORAL_TABLET | Freq: Three times a day (TID) | ORAL | 0 refills | Status: AC
  Filled 2023-08-24 – 2023-09-02 (×3): qty 90, 30d supply, fill #0

## 2023-08-26 ENCOUNTER — Other Ambulatory Visit (HOSPITAL_BASED_OUTPATIENT_CLINIC_OR_DEPARTMENT_OTHER): Payer: Self-pay

## 2023-08-28 ENCOUNTER — Other Ambulatory Visit (HOSPITAL_BASED_OUTPATIENT_CLINIC_OR_DEPARTMENT_OTHER): Payer: Self-pay

## 2023-08-29 ENCOUNTER — Ambulatory Visit: Payer: Medicare HMO | Admitting: Allergy & Immunology

## 2023-08-29 ENCOUNTER — Other Ambulatory Visit: Payer: Self-pay

## 2023-08-29 ENCOUNTER — Encounter: Payer: Self-pay | Admitting: Allergy & Immunology

## 2023-08-29 VITALS — BP 126/80 | HR 94 | Temp 97.8°F | Resp 18 | Ht 58.27 in | Wt 119.0 lb

## 2023-08-29 DIAGNOSIS — L2989 Other pruritus: Secondary | ICD-10-CM | POA: Diagnosis not present

## 2023-08-29 DIAGNOSIS — J310 Chronic rhinitis: Secondary | ICD-10-CM

## 2023-08-29 DIAGNOSIS — L299 Pruritus, unspecified: Secondary | ICD-10-CM

## 2023-08-29 NOTE — Progress Notes (Signed)
FOLLOW UP  Date of Service/Encounter:  08/29/23   Assessment:   Pruritic condition  Chronic rhinitis  Dry mouth to antihistamines   Plan/Recommendations:   1. Pruritus - We are going to get some labs to look for serious causes of itching/allergic reactions. - We are just going to start from scratch with some new medications.  - We may consider the addition of an injectable medication.  - In the meantime, continue with Claritin one tablet daily to help with the itching.   2. Chronic rhinitis - We will get some environmental allergy testing via the blood. - We will get you scheduled for intradermal testing, which is more sensitive. - We will try to get the records from [place].  - Once we get the records, hopefully we can put some pieces together.   3. Return in about 3 months (around 11/29/2023). You can have the follow up appointment with Dr. Dellis Anes or a Nurse Practicioner (our Nurse Practitioners are excellent and always have Physician oversight!).    Subjective:   Michele [name] is a 57 y.o. female presenting today for follow up of  Chief Complaint  Patient presents with   Pruritus    EYES, NOSE,FACE, HEAD - dust allergy     Michele [name] has a history of the following: Patient Active Problem List   Diagnosis Date Noted   Chronic pruritus 08/06/2022   Nonallergic rhinitis 08/06/2022   Osteoma of skull 05/27/2017   Abdominal bloating 08/26/2015   Vaginitis and vulvovaginitis 03/01/2010   HYPERCHOLESTEROLEMIA 01/02/2010   DEPRESSION 09/07/2009   PELVIC  PAIN 05/02/2009   Candidiasis 04/22/2009   VULVODYNIA UNSPECIFIED 04/22/2009   Anxiety state 10/11/2008   UTI'S, RECURRENT 10/11/2008   BREAST MASS, RIGHT 07/29/2008   ACNE NEC 08/14/2007   History of urinary tract infection 08/14/2007    History obtained from: chart review and patient and mother.  Discussed the use of AI scribe software for clinical note transcription with the patient, who gave verbal  consent to proceed.  Michele [name] is a 57 y.o. female presenting for a follow up visit.  She was last seen in September 2023 by Dr. Maurine Minister.  At that time, she remained on Benadryl as needed.  She had already been tested at her outside allergist in September 2022.  She had a history of chronic itching.  She was put on Derma-Smoothe.  Per the first visit with Dr. Maurine Minister, she was "allergic to everything" at her previous allergy office.   The patient, previously seen by Dr. Maurine Minister, presents with a severe dust allergy that has significantly impacted her quality of life. The allergy is so severe that it has led to the patient having to make drastic changes in their living environment, including removing all furniture and clothing from her room, and washing items multiple times to remove any trace of dust. The patient also reports an extreme sensitivity to fragrances, which has further limited her ability to use common household items.  The patient has been managing her dust allergy with Claritin and Flonase, but reports that this combination of medications has not been effective in controlling her symptoms. The patient describes her symptoms as being so severe that she feels she is "going nuts," and that her life has been "ruined" by her condition. The patient's symptoms include severe itching, particularly in the nose and on the scalp, which often requires her to wash her hair multiple times a day.   Her mother agrees with this and spends a lot  of time discussing the cost of her water billing since Persephanie lives with her. She also mentions that she has issues with having her friends over because any kind of scent or exposure from her friends might trigger Mikeyla's symptoms to get out of control. It is clearly providing some friction between the patient and her mother.   The patient's allergy symptoms have been ongoing for several years, but she reports that her condition worsened significantly after receiving  vaccinations and contracting COVID-19 in 2020. The patient believes that these events may have somehow altered her immune response, leading to her current extreme sensitivity to dust and fragrances.  In addition to her allergy symptoms, the patient also reports having back and neck pain, vulvodynia, and plantar fasciitis, for which she is in pain management and takes gabapentin. These issues predated the emergence of her reactions. 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 The patient is currently on disability due to her health issues.  The patient has previously undergone allergy shots for dust and tree allergens at [facility], but reports that these treatments did not provide any relief. The patient expresses frustration that despite her severe symptoms, allergy tests have not consistently shown a significant reaction to dust (she did NOT have intradermal testing at the last visit with Dr. Maurine Minister). I cannot seem to find the outside records from [facility], but she tells me that she was positive to "everything" when she was tested there.   She was weaning her opioids at the last visit with Dr. Maurine Minister, presumably because it was felt that this might be related to her chronic itching. She did get onto the [facility] Patient Portal and showed me an IgE that was flagged as low. She is very concerned with this.   The patient is eager to find a more effective treatment for her symptoms and is open to further testing and treatment options.     Otherwise, there have been no changes to her past medical history, surgical history, family history, or social history.    Review of systems otherwise negative other than that mentioned in the HPI.    Objective:   Blood pressure 126/80, pulse 94, temperature 97.8 F (36.6 C), resp. rate 18, height 4' 10.27" (1.48 m), weight 119 lb (54 kg), SpO2 98%. Body mass index is 24.64 kg/m.    Physical  Exam Vitals reviewed.  Constitutional:      Appearance: She is well-developed.     Comments: Very anxious.   HENT:     Head: Normocephalic and atraumatic.     Right Ear: Tympanic membrane, ear canal and external ear normal. No drainage, swelling or tenderness. Tympanic membrane is not injected, scarred, erythematous, retracted or bulging.     Left Ear: Tympanic membrane, ear canal and external ear normal. No drainage, swelling or tenderness. Tympanic membrane is not injected, scarred, erythematous, retracted or bulging.     Nose: Rhinorrhea present. No nasal deformity, septal deviation or mucosal edema.     Right Turbinates: Not enlarged or swollen.     Left Turbinates: Not enlarged or swollen.     Right Sinus: No maxillary sinus tenderness or frontal sinus tenderness.     Left Sinus: No maxillary sinus tenderness or frontal sinus tenderness.     Mouth/Throat:     Lips: Pink.     Mouth: Mucous membranes are moist. Mucous membranes are not pale and not dry.     Pharynx:  Uvula midline.  Eyes:     General:        Right eye: No discharge.        Left eye: No discharge.     Conjunctiva/sclera: Conjunctivae normal.     Right eye: Right conjunctiva is not injected. No chemosis.    Left eye: Left conjunctiva is not injected. No chemosis.    Pupils: Pupils are equal, round, and reactive to light.  Cardiovascular:     Rate and Rhythm: Normal rate and regular rhythm.     Heart sounds: Normal heart sounds.  Pulmonary:     Effort: Pulmonary effort is normal. No tachypnea, accessory muscle usage or respiratory distress.     Breath sounds: Normal breath sounds. No wheezing, rhonchi or rales.  Chest:     Chest wall: No tenderness.  Abdominal:     Tenderness: There is no abdominal tenderness. There is no guarding or rebound.  Lymphadenopathy:     Head:     Right side of head: No submandibular, tonsillar or occipital adenopathy.     Left side of head: No submandibular, tonsillar or occipital  adenopathy.     Cervical: No cervical adenopathy.  Skin:    General: Skin is warm.     Capillary Refill: Capillary refill takes less than 2 seconds.     Coloration: Skin is not pale.     Findings: No abrasion, erythema, petechiae or rash. Rash is not papular, urticarial or vesicular.     Comments: Excoriations present.   Neurological:     Mental Status: She is alert.  Psychiatric:        Behavior: Behavior is cooperative.      Diagnostic studies: labs sent instead       Malachi Bonds, MD  [facility]

## 2023-08-29 NOTE — Patient Instructions (Addendum)
1. Pruritus - We are going to get some labs to look for serious causes of itching/allergic reactions. - We are just going to start from scratch with some new medications.  - We may consider the addition of an injectable medication.  - In the meantime, continue with Claritin one tablet daily to help with the itching.   2. Chronic rhinitis - We will get some environmental allergy testing via the blood. - We will get you scheduled for intradermal testing, which is more sensitive. - We will try to get the records from [place].  - Once we get the records, hopefully we can put some pieces together.   3. Return in about 3 months (around 11/29/2023). You can have the follow up appointment with Dr. Dellis Anes or a Nurse Practicioner (our Nurse Practitioners are excellent and always have Physician oversight!).    Please inform us of any Emergency Department visits, hospitalizations, or changes in symptoms. Call us before going to the ED for breathing or allergy symptoms since we might be able to fit you in for a sick visit. Feel free to contact us anytime with any questions, problems, or concerns.  It was a pleasure to meet you and your mother today!  Websites that have reliable patient information: 1. American Academy of Asthma, Allergy, and Immunology: www.aaaai.org 2. Food Allergy Research and Education (FARE): foodallergy.org 3. Mothers of Asthmatics: http://www.asthmacommunitynetwork.org 4. American College of Allergy, Asthma, and Immunology: www.acaai.org   COVID-19 Vaccine Information can be found at: [email or web address].[email or web address] For questions related to vaccine distribution or appointments, please email [email or web address] or call [phone].     "Like" [place] on Facebook and Instagram for our latest updates!      A healthy democracy works best when [employer] participate! Make sure you are registered to vote! If you have moved or changed  any of your contact information, you will need to get this updated before voting! Scan the QR codes below to learn more!

## 2023-08-30 ENCOUNTER — Encounter: Payer: Self-pay | Admitting: Allergy & Immunology

## 2023-08-31 ENCOUNTER — Other Ambulatory Visit (HOSPITAL_BASED_OUTPATIENT_CLINIC_OR_DEPARTMENT_OTHER): Payer: Self-pay

## 2023-09-02 ENCOUNTER — Other Ambulatory Visit: Payer: Self-pay

## 2023-09-02 ENCOUNTER — Other Ambulatory Visit (HOSPITAL_BASED_OUTPATIENT_CLINIC_OR_DEPARTMENT_OTHER): Payer: Self-pay

## 2023-09-07 LAB — CMP14+EGFR
ALT: 22 [IU]/L (ref 0–32)
AST: 36 [IU]/L (ref 0–40)
Albumin: 4.6 g/dL (ref 3.8–4.9)
Alkaline Phosphatase: 73 [IU]/L (ref 44–121)
BUN/Creatinine Ratio: 7 — ABNORMAL LOW (ref 9–23)
BUN: 7 mg/dL (ref 6–24)
Bilirubin Total: 0.2 mg/dL (ref 0.0–1.2)
CO2: 32 mmol/L — ABNORMAL HIGH (ref 20–29)
Calcium: 10.7 mg/dL — ABNORMAL HIGH (ref 8.7–10.2)
Chloride: 85 mmol/L — ABNORMAL LOW (ref 96–106)
Creatinine, Ser: 0.95 mg/dL (ref 0.57–1.00)
Globulin, Total: 2.3 g/dL (ref 1.5–4.5)
Glucose: 86 mg/dL (ref 70–99)
Potassium: 4.3 mmol/L (ref 3.5–5.2)
Sodium: 133 mmol/L — ABNORMAL LOW (ref 134–144)
Total Protein: 6.9 g/dL (ref 6.0–8.5)
eGFR: 70 mL/min/{1.73_m2} (ref >59)

## 2023-09-07 LAB — ANTINUCLEAR ANTIBODIES, IFA: ANA Titer 1: POSITIVE — AB

## 2023-09-07 LAB — ALLERGENS W/COMP RFLX AREA 2
Alternaria Alternata IgE: <0.1 kU/L
Aspergillus Fumigatus IgE: <0.1 kU/L
Bermuda Grass IgE: <0.1 kU/L
Cedar, Mountain IgE: <0.1 kU/L
Cladosporium Herbarum IgE: <0.1 kU/L
Cockroach, German IgE: <0.1 kU/L
Common Silver Birch IgE: <0.1 kU/L
Cottonwood IgE: <0.1 kU/L
D Farinae IgE: <0.1 kU/L
D Pteronyssinus IgE: <0.1 kU/L
E001-IgE Cat Dander: <0.1 kU/L
E005-IgE Dog Dander: <0.1 kU/L
Elm, American IgE: <0.1 kU/L
Johnson Grass IgE: <0.1 kU/L
Maple/Box Elder IgE: <0.1 kU/L
Mouse Urine IgE: <0.1 kU/L
Oak, White IgE: <0.1 kU/L
Pecan, Hickory IgE: <0.1 kU/L
Penicillium Chrysogen IgE: <0.1 kU/L
Pigweed, Rough IgE: <0.1 kU/L
Ragweed, Short IgE: <0.1 kU/L
Sheep Sorrel IgE Qn: <0.1 kU/L
Timothy Grass IgE: <0.1 kU/L
White Mulberry IgE: <0.1 kU/L

## 2023-09-07 LAB — C-REACTIVE PROTEIN: CRP: 1 mg/L (ref 0–10)

## 2023-09-07 LAB — KIT (D816V) DIGITAL PCR: CKIT Result: NEGATIVE

## 2023-09-07 LAB — THYROID ANTIBODIES
Thyroglobulin Antibody: <1 [IU]/mL (ref 0.0–0.9)
Thyroperoxidase Ab SerPl-aCnc: 9 [IU]/mL (ref 0–34)

## 2023-09-07 LAB — PROTEIN ELECTROPHORESIS, SERUM, WITH REFLEX
A/G Ratio: 1.4 (ref 0.7–1.7)
Albumin ELP: 4 g/dL (ref 2.9–4.4)
Alpha 1: 0.3 g/dL (ref 0.0–0.4)
Alpha 2: 0.8 g/dL (ref 0.4–1.0)
Beta: 1.1 g/dL (ref 0.7–1.3)
Gamma Globulin: 0.7 g/dL (ref 0.4–1.8)
Globulin, Total: 2.9 g/dL (ref 2.2–3.9)

## 2023-09-07 LAB — ALPHA-GAL PANEL
Allergen Lamb IgE: <0.1 kU/L
Beef IgE: <0.1 kU/L
IgE (Immunoglobulin E), Serum: 6 [IU]/mL (ref 6–495)
O215-IgE Alpha-Gal: <0.1 kU/L
Pork IgE: <0.1 kU/L

## 2023-09-07 LAB — RHEUMATOID FACTOR: Rheumatoid fact SerPl-aCnc: 10.8 [IU]/mL (ref <14.0)

## 2023-09-07 LAB — FANA STAINING PATTERNS: Speckled Pattern: 1:80 {titer}

## 2023-09-07 LAB — SEDIMENTATION RATE: Sed Rate: 13 mm/h (ref 0–40)

## 2023-09-07 LAB — CHRONIC URTICARIA: cu index: <5.5 (ref <10)

## 2023-09-07 LAB — TRYPTASE: Tryptase: 4.8 ug/L (ref 2.2–13.2)

## 2023-09-08 ENCOUNTER — Other Ambulatory Visit (HOSPITAL_BASED_OUTPATIENT_CLINIC_OR_DEPARTMENT_OTHER): Payer: Self-pay

## 2023-09-09 ENCOUNTER — Other Ambulatory Visit (HOSPITAL_BASED_OUTPATIENT_CLINIC_OR_DEPARTMENT_OTHER): Payer: Self-pay

## 2023-09-10 ENCOUNTER — Telehealth: Payer: Self-pay

## 2023-09-10 NOTE — Telephone Encounter (Signed)
Patient called to see if Dr. Dellis Anes had resulted her lab work. I did let her know the provider usually results everything once all lab work is back. Patient is also wondering if you still want her to do intradermal testing?

## 2023-09-18 NOTE — Telephone Encounter (Signed)
Pt request a call back about her lab work she has questions about a specific test.

## 2023-09-18 NOTE — Telephone Encounter (Signed)
Sure thing - let's get her scheduled. I wrote her a message today with the results.   Malachi Bonds, MD [facility]

## 2023-09-18 NOTE — Telephone Encounter (Signed)
Returned the phone call to patient, but there was no answer and there was not a voicemail/answering machine, wasn't able to leave a message.   Michele [name]

## 2023-09-19 NOTE — Telephone Encounter (Signed)
I have already scheduled the patient for intradermals. She called the office after sending the [employer].   Thanks

## 2023-09-25 ENCOUNTER — Other Ambulatory Visit (HOSPITAL_BASED_OUTPATIENT_CLINIC_OR_DEPARTMENT_OTHER): Payer: Self-pay

## 2023-09-25 MED ORDER — LORAZEPAM 2 MG PO TABS
2.0000 mg | ORAL_TABLET | Freq: Three times a day (TID) | ORAL | 0 refills | Status: DC
Start: 2023-10-02 — End: 2024-04-20
  Filled 2023-10-08: qty 90, 30d supply, fill #0

## 2023-10-08 ENCOUNTER — Other Ambulatory Visit (HOSPITAL_BASED_OUTPATIENT_CLINIC_OR_DEPARTMENT_OTHER): Payer: Self-pay

## 2023-10-08 ENCOUNTER — Other Ambulatory Visit: Payer: Self-pay

## 2023-10-09 ENCOUNTER — Other Ambulatory Visit (HOSPITAL_BASED_OUTPATIENT_CLINIC_OR_DEPARTMENT_OTHER): Payer: Self-pay

## 2023-10-10 ENCOUNTER — Ambulatory Visit: Payer: Medicare HMO | Admitting: Allergy & Immunology

## 2023-10-10 DIAGNOSIS — J3089 Other allergic rhinitis: Secondary | ICD-10-CM | POA: Diagnosis not present

## 2023-10-10 NOTE — Progress Notes (Signed)
FOLLOW UP  Date of Service/Encounter:  10/10/23   Assessment:   Perennial allergic rhinitis (indoor molds and dust mites) - interested in starting allergen immunotherapy  Pruritus   Dry mouth secondary to antihistamines  Complicated past medical history, including anxiety and depression  Plan/Recommendations:   1. Perennial allergic rhinitis - Testing today showed: indoor molds and dust mites - Copy of test results provided.  - Avoidance measures provided. - Continue with: Claritin (loratadine) 10mg  tablet once daily and Flonase (fluticasone) one spray per nostril daily (AIM FOR EAR ON EACH SIDE) - You can use an extra dose of the antihistamine, if needed, for breakthrough symptoms.  - Consider nasal saline rinses 1-2 times daily to remove allergens from the nasal cavities as well as help with mucous clearance (this is especially helpful to do before the nasal sprays are given) - We will start allergy shots as a means of long-term control. - Allergy shots "re-train" and "reset" the immune system to ignore environmental allergens and decrease the resulting immune response to those allergens (sneezing, itchy watery eyes, runny nose, nasal congestion, etc).    - Allergy shots improve symptoms in 75-85% of patients.  - We can be more aggressive with the itching as well, if the shots do not work. - They are a long term commitment, so do not expect improvement overnight.  2. Return in about 6 months (around 04/08/2024). You can have the follow up appointment with Dr. Dellis Anes or a Nurse Practicioner (our Nurse Practitioners are excellent and always have Physician oversight!).   Subjective:   Shauri Ellingham is a 57 y.o. female presenting today for follow up of No chief complaint on file.   Callie Desmidt has a history of the following: Patient Active Problem List   Diagnosis Date Noted   Chronic pruritus 08/06/2022   Nonallergic rhinitis 08/06/2022   Osteoma of skull 05/27/2017    Abdominal bloating 08/26/2015   Vaginitis and vulvovaginitis 03/01/2010   HYPERCHOLESTEROLEMIA 01/02/2010   DEPRESSION 09/07/2009   PELVIC  PAIN 05/02/2009   Candidiasis 04/22/2009   VULVODYNIA UNSPECIFIED 04/22/2009   Anxiety state 10/11/2008   UTI'S, RECURRENT 10/11/2008   BREAST MASS, RIGHT 07/29/2008   ACNE NEC 08/14/2007   History of urinary tract infection 08/14/2007    History obtained from: chart review and patient.  Discussed the use of AI scribe software for clinical note transcription with the patient and/or guardian, who gave verbal consent to proceed.  Kiyra is a 57 y.o. female presenting for skin testing. She was last seen on 08/29/23. We could not do testing because her insurance company does not cover testing on the same day as a New Patient visit. She has been off of all antihistamines 3 days in anticipation of the testing.   We did a large workup at the last visit for hives and swelling.  She did come back positive with an ANA which had a very low titer.  Inflammatory markers were normal.  Alpha gal was negative.  Environmental allergy panel was negative.  Chronic urticaria panel was negative.  Tryptase was normal.  Thyroid antibodies were negative.  An SPEP was negative.  Rheumatoid factor was negative.  C-kit genetic mutation analysis was negative as well.  Otherwise, there have been no changes to her past medical history, surgical history, family history, or social history.    Review of systems otherwise negative other than that mentioned in the HPI.    Objective:   There were no vitals taken for  this visit. There is no height or weight on file to calculate BMI.    Physical exam deferred since this was a skin testing appointment only.   Diagnostic studies:     Allergy Studies:     Intradermal - 10/10/23 1454     Time Antigen Placed 1454    Allergen Manufacturer Waynette Buttery    Location Arm    Number of Test 16    Intradermal Select    Control Negative     B[place]ia Negative    [place] Negative    [place] Negative    7 Grass Negative    Ragweed Mix Negative    Weed Mix Negative    Tree Mix Negative    Mold 1 Negative    Mold 2 Negative    Mold 3 Negative    Mold 4 4+    Mite Mix 2+    Cat Negative    Dog Negative    Cockroach Negative             Allergy testing results were read and interpreted by myself, documented by clinical staff.      Malachi Bonds, MD  [facility]

## 2023-10-10 NOTE — Patient Instructions (Addendum)
1. Perennial allergic rhinitis - Testing today showed: indoor molds and dust mites - Copy of test results provided.  - Avoidance measures provided. - Continue with: Claritin (loratadine) 10mg  tablet once daily and Flonase (fluticasone) one spray per nostril daily (AIM FOR EAR ON EACH SIDE) - You can use an extra dose of the antihistamine, if needed, for breakthrough symptoms.  - Consider nasal saline rinses 1-2 times daily to remove allergens from the nasal cavities as well as help with mucous clearance (this is especially helpful to do before the nasal sprays are given) - We will start allergy shots as a means of long-term control. - Allergy shots "re-train" and "reset" the immune system to ignore environmental allergens and decrease the resulting immune response to those allergens (sneezing, itchy watery eyes, runny nose, nasal congestion, etc).    - Allergy shots improve symptoms in 75-85% of patients.  - We can be more aggressive with the itching as well, if the shots do not work. - They are a long term commitment, so do not expect improvement overnight.  2. Return in about 6 months (around 04/08/2024). You can have the follow up appointment with Dr. Dellis Anes or a Nurse Practicioner (our Nurse Practitioners are excellent and always have Physician oversight!).    Please inform us of any Emergency Department visits, hospitalizations, or changes in symptoms. Call us before going to the ED for breathing or allergy symptoms since we might be able to fit you in for a sick visit. Feel free to contact us anytime with any questions, problems, or concerns.  It was a pleasure to see you again today!  Websites that have reliable patient information: 1. American Academy of Asthma, Allergy, and Immunology: www.aaaai.org 2. Food Allergy Research and Education (FARE): foodallergy.org 3. Mothers of Asthmatics: http://www.asthmacommunitynetwork.org 4. American College of Allergy, Asthma, and Immunology:  www.acaai.org      "Like" [place] on Facebook and Instagram for our latest updates!      A healthy democracy works best when [employer] participate! Make sure you are registered to vote! If you have moved or changed any of your contact information, you will need to get this updated before voting! Scan the QR codes below to learn more!       Intradermal - 10/10/23 1454     Time Antigen Placed 1454    Allergen Manufacturer Waynette Buttery    Location Arm    Number of Test 16    Intradermal Select    Control Negative    [place] Negative    [place] Negative    [place] Negative    7 Grass Negative    Ragweed Mix Negative    Weed Mix Negative    Tree Mix Negative    Mold 1 Negative    Mold 2 Negative    Mold 3 Negative    Mold 4 4+    Mite Mix 2+    Cat Negative    Dog Negative    Cockroach Negative             Control of Mold Allergen   Mold and fungi can grow on a variety of surfaces provided certain temperature and moisture conditions exist.  Outdoor molds grow on plants, decaying vegetation and soil.  The major outdoor mold, Alternaria and Cladosporium, are found in very high numbers during hot and dry conditions.  Generally, a late Summer - Fall peak is seen for common outdoor fungal spores.  Rain will temporarily lower outdoor mold spore count,  but counts rise rapidly when the rainy period ends.  The most important indoor molds are Aspergillus and Penicillium.  Dark, humid and poorly ventilated basements are ideal sites for mold growth.  The next most common sites of mold growth are the bathroom and the kitchen.  Indoor (Perennial) Mold Control   Positive indoor molds via skin testing: Fusarium, Aureobasidium (Pullulara), and Rhizopus  Maintain humidity below 50%. Clean washable surfaces with 5% bleach solution. Remove sources e.g. contaminated carpets.    Control of Dust Mite Allergen    Dust mites play a major role in allergic asthma and rhinitis.  They occur in  environments with high humidity wherever human skin is found.  Dust mites absorb humidity from the atmosphere (ie, they do not drink) and feed on organic matter (including shed human and animal skin).  Dust mites are a microscopic type of insect that you cannot see with the naked eye.  High levels of dust mites have been detected from mattresses, pillows, carpets, upholstered furniture, bed covers, clothes, soft toys and any woven material.  The principal allergen of the dust mite is found in its feces.  A gram of dust may contain 1,000 mites and 250,000 fecal particles.  Mite antigen is easily measured in the air during house cleaning activities.  Dust mites do not bite and do not cause harm to humans, other than by triggering allergies/asthma.    Ways to decrease your exposure to dust mites in your home:  Encase mattresses, box springs and pillows with a mite-impermeable barrier or cover   Wash sheets, blankets and drapes weekly in hot water (130 F) with detergent and dry them in a dryer on the hot setting.  Have the room cleaned frequently with a vacuum cleaner and a damp dust-mop.  For carpeting or rugs, vacuuming with a vacuum cleaner equipped with a high-efficiency particulate air (HEPA) filter.  The dust mite allergic individual should not be in a room which is being cleaned and should wait 1 hour after cleaning before going into the room. Do not sleep on upholstered furniture (eg, couches).   If possible removing carpeting, upholstered furniture and drapery from the home is ideal.  Horizontal blinds should be eliminated in the rooms where the person spends the most time (bedroom, study, television room).  Washable vinyl, roller-type shades are optimal. Remove all non-washable stuffed toys from the bedroom.  Wash stuffed toys weekly like sheets and blankets above.   Reduce indoor humidity to less than 50%.  Inexpensive humidity monitors can be purchased at most hardware stores.  Do not use a  humidifier as can make the problem worse and are not recommended.   Allergy Shots  Allergies are the result of a chain reaction that starts in the immune system. Your immune system controls how your body defends itself. For instance, if you have an allergy to pollen, your immune system identifies pollen as an invader or allergen. Your immune system overreacts by producing antibodies called Immunoglobulin E (IgE). These antibodies travel to cells that release chemicals, causing an allergic reaction.  The concept behind allergy immunotherapy, whether it is received in the form of shots or tablets, is that the immune system can be desensitized to specific allergens that trigger allergy symptoms. Although it requires time and patience, the payback can be long-term relief. Allergy injections contain a dilute solution of those substances that you are allergic to based upon your skin testing and allergy history.   How Do Allergy Shots Work?  Allergy shots work much like a vaccine. Your body responds to injected amounts of a particular allergen given in increasing doses, eventually developing a resistance and tolerance to it. Allergy shots can lead to decreased, minimal or no allergy symptoms.  There generally are two phases: build-up and maintenance. Build-up often ranges from three to six months and involves receiving injections with increasing amounts of the allergens. The shots are typically given once or twice a week, though more rapid build-up schedules are sometimes used.  The maintenance phase begins when the most effective dose is reached. This dose is different for each person, depending on how allergic you are and your response to the build-up injections. Once the maintenance dose is reached, there are longer periods between injections, typically two to four weeks.  Occasionally doctors give cortisone-type shots that can temporarily reduce allergy symptoms. These types of shots are different and  should not be confused with allergy immunotherapy shots.  Who Can Be Treated with Allergy Shots?  Allergy shots may be a good treatment approach for people with allergic rhinitis (hay fever), allergic asthma, conjunctivitis (eye allergy) or stinging insect allergy.   Before deciding to begin allergy shots, you should consider:   The length of allergy season and the severity of your symptoms  Whether medications and/or changes to your environment can control your symptoms  Your desire to avoid long-term medication use  Time: allergy immunotherapy requires a major time commitment  Cost: may vary depending on your insurance coverage  Allergy shots for children age 74 and older are effective and often well tolerated. They might prevent the onset of new allergen sensitivities or the progression to asthma.  Allergy shots are not started on patients who are pregnant but can be continued on patients who become pregnant while receiving them. In some patients with other medical conditions or who take certain common medications, allergy shots may be of risk. It is important to mention other medications you talk to your allergist.   What are the two types of build-ups offered:   RUSH or Rapid Desensitization -- one day of injections lasting from 8:30-4:30pm, injections every 1 hour.  Approximately half of the build-up process is completed in that one day.  The following week, normal build-up is resumed, and this entails ~16 visits either weekly or twice weekly, until reaching your "maintenance dose" which is continued weekly until eventually getting spaced out to every month for a duration of 3 to 5 years. The regular build-up appointments are nurse visits where the injections are administered, followed by required monitoring for 30 minutes.    Traditional build-up -- weekly visits for 6 -12 months until reaching "maintenance dose", then continue weekly until eventually spacing out to every 4 weeks as  above. At these appointments, the injections are administered, followed by required monitoring for 30 minutes.     Either way is acceptable, and both are equally effective. With the rush protocol, the advantage is that less time is spent here for injections overall AND you would also reach maintenance dosing faster (which is when the clinical benefit starts to become more apparent). Not everyone is a candidate for rapid desensitization.   IF we proceed with the RUSH protocol, there are premedications which must be taken the day before and the day after the rush only (this includes antihistamines, steroids, and Singulair).  After the rush day, no prednisone or Singulair is required, and we just recommend antihistamines taken on your injection day.  What Is An  Estimate of the Costs?  If you are interested in starting allergy injections, please check with your insurance company about your coverage for both allergy vial sets and allergy injections.  Please do so prior to making the appointment to start injections.  The following are CPT codes to give to your insurance company. These are the amounts we BILL to the insurance company, but the amount YOU WILL PAY and WE RECEIVE IS SUBSTANTIALLY LESS and depends on the contracts we have with different insurance companies.   Amount Billed to Insurance One allergy vial set  CPT 95165   $ 1200     Two allergy vial set  CPT 95165   $ 2400     Three allergy vial set  CPT 95165   $ 3600     One injection   CPT 95115   $ 35  Two injections   CPT 95117   $ 40 RUSH (Rapid Desensitization) CPT 95180 x 8 hours $500/hour  Regarding the allergy injections, your co-pay may or may not apply with each injection, so please confirm this with your insurance company. When you start allergy injections, 1 or 2 sets of vials are made based on your allergies.  Not all patients can be on one set of vials. A set of vials lasts 6 months to a year depending on how quickly you can  proceed with your build-up of your allergy injections. Vials are personalized for each patient depending on their specific allergens.  How often are allergy injection given during the build-up period?   Injections are given at least weekly during the build-up period until your maintenance dose is achieved. Per the doctor's discretion, you may have the option of getting allergy injections two times per week during the build-up period. However, there must be at least 48 hours between injections. The build-up period is usually completed within 6-12 months depending on your ability to schedule injections and for adjustments for reactions. When maintenance dose is reached, your injection schedule is gradually changed to every two weeks and later to every three weeks. Injections will then continue every 4 weeks. Usually, injections are continued for a total of 3-5 years.   When Will I Feel Better?  Some may experience decreased allergy symptoms during the build-up phase. For others, it may take as long as 12 months on the maintenance dose. If there is no improvement after a year of maintenance, your allergist will discuss other treatment options with you.  If you aren't responding to allergy shots, it may be because there is not enough dose of the allergen in your vaccine or there are missing allergens that were not identified during your allergy testing. Other reasons could be that there are high levels of the allergen in your environment or major exposure to non-allergic triggers like tobacco smoke.  What Is the Length of Treatment?  Once the maintenance dose is reached, allergy shots are generally continued for three to five years. The decision to stop should be discussed with your allergist at that time. Some people may experience a permanent reduction of allergy symptoms. Others may relapse and a longer course of allergy shots can be considered.  What Are the Possible Reactions?  The two types of  adverse reactions that can occur with allergy shots are local and systemic. Common local reactions include very mild redness and swelling at the injection site, which can happen immediately or several hours after. Report a delayed reaction from your last injection. These include  arm swelling or runny nose, watery eyes or cough that occurs within 12-24 hours after injection. A systemic reaction, which is less common, affects the entire body or a particular body system. They are usually mild and typically respond quickly to medications. Signs include increased allergy symptoms such as sneezing, a stuffy nose or hives.   Rarely, a serious systemic reaction called anaphylaxis can develop. Symptoms include swelling in the throat, wheezing, a feeling of tightness in the chest, nausea or dizziness. Most serious systemic reactions develop within 30 minutes of allergy shots. This is why it is strongly recommended you wait in your doctor's office for 30 minutes after your injections. Your allergist is trained to watch for reactions, and his or her staff is trained and equipped with the proper medications to identify and treat them.   Report to the nurse immediately if you experience any of the following symptoms: swelling, itching or redness of the skin, hives, watery eyes/nose, breathing difficulty, excessive sneezing, coughing, stomach pain, diarrhea, or light headedness. These symptoms may occur within 15-20 minutes after injection and may require medication.   Who Should Administer Allergy Shots?  The preferred location for receiving shots is your prescribing allergist's office. Injections can sometimes be given at another facility where the physician and staff are trained to recognize and treat reactions, and have received instructions by your prescribing allergist.  What if I am late for an injection?   Injection dose will be adjusted depending upon how many days or weeks you are late for your injection.    What if I am sick?   Please report any illness to the nurse before receiving injections. She may adjust your dose or postpone injections depending on your symptoms. If you have fever, flu, sinus infection or chest congestion it is best to postpone allergy injections until you are better. Never get an allergy injection if your asthma is causing you problems. If your symptoms persist, seek out medical care to get your health problem under control.  What If I am or Become Pregnant:  Women that become pregnant should schedule an appointment with [facility] before receiving any further allergy injections.

## 2023-10-11 ENCOUNTER — Encounter: Payer: Self-pay | Admitting: Allergy & Immunology

## 2023-10-11 MED ORDER — LORATADINE 10 MG PO TABS
10.0000 mg | ORAL_TABLET | Freq: Every day | ORAL | 5 refills | Status: DC
  Filled 2023-12-17 – 2023-12-27 (×2): qty 30, 30d supply, fill #0
  Filled 2024-01-23 (×2): qty 30, 30d supply, fill #1
  Filled 2024-06-12: qty 30, 30d supply, fill #2
  Filled 2024-08-10: qty 30, 30d supply, fill #3
  Filled 2024-09-29: qty 30, 30d supply, fill #4

## 2023-10-11 MED ORDER — EPINEPHRINE 0.3 MG/0.3ML IJ SOAJ: 0.3000 mg | INTRAMUSCULAR | 1 refills | Status: AC | PRN

## 2023-10-11 MED ORDER — FLUTICASONE PROPIONATE 50 MCG/ACT NA SUSP
1.0000 | Freq: Every day | NASAL | 5 refills | Status: DC
  Filled 2024-01-06: qty 16, 60d supply, fill #0
  Filled 2024-01-10: qty 16, 30d supply, fill #0
  Filled 2024-01-29: qty 16, 60d supply, fill #0
  Filled 2024-04-21 – 2024-08-10 (×2): qty 16, 60d supply, fill #1

## 2023-10-14 ENCOUNTER — Other Ambulatory Visit (HOSPITAL_BASED_OUTPATIENT_CLINIC_OR_DEPARTMENT_OTHER): Payer: Self-pay

## 2023-10-14 DIAGNOSIS — J3089 Other allergic rhinitis: Secondary | ICD-10-CM | POA: Diagnosis not present

## 2023-10-14 NOTE — Progress Notes (Signed)
EXP 10/13/24

## 2023-10-14 NOTE — Progress Notes (Signed)
Aeroallergen Immunotherapy  Ordering Provider: Dr. Malachi Bonds  Patient Details Name: Michele [name] MRN: [id number] Date of Birth: [birth year]  Order 1 of 1  Vial Label: Molds/DM  0.2 ml (Volume)  1:10 Concentration -- Fusarium moniliforme 0.2 ml (Volume)  1:40 Concentration -- Aureobasidium pullulans 0.2 ml (Volume)  1:10 Concentration -- Rhizopus oryzae 1.0 ml (Volume)   AU Concentration -- Mite Mix (DF 5,000 & DP 5,000)   1.6  ml Extract Subtotal 3.4  ml Diluent 5.0  ml Maintenance Total  Schedule:  A Silver Vial (1:1,000,000): Schedule A (10 doses) Blue Vial (1:100,000): Schedule A (10 doses) Yellow Vial (1:10,000): Schedule A (10 doses) Green Vial (1:1,000): Schedule A (10 doses) Red Vial (1:100): Schedule A (10 doses)  Special Instructions: After completion of the first Red Vial, please space to every two weeks. After completion of the second Red Vial, please space to every 4 weeks. Ok to up dose new vials at 0.24mL --> 0.3 mL --> 0.5 mL.

## 2023-10-23 ENCOUNTER — Other Ambulatory Visit (HOSPITAL_BASED_OUTPATIENT_CLINIC_OR_DEPARTMENT_OTHER): Payer: Self-pay

## 2023-10-23 MED ORDER — LORAZEPAM 2 MG PO TABS
2.0000 mg | ORAL_TABLET | Freq: Three times a day (TID) | ORAL | 0 refills | Status: DC
  Filled 2023-11-08: qty 90, 30d supply, fill #0

## 2023-10-23 MED ORDER — ALBUTEROL SULFATE HFA 108 (90 BASE) MCG/ACT IN AERS
1.0000 | INHALATION_SPRAY | Freq: Four times a day (QID) | RESPIRATORY_TRACT | 2 refills | Status: DC | PRN
  Filled 2023-10-23: qty 8.5, 18d supply, fill #0
  Filled 2024-01-05: qty 8.5, 18d supply, fill #1
  Filled 2024-01-29: qty 8.5, 18d supply, fill #2

## 2023-10-28 ENCOUNTER — Other Ambulatory Visit (HOSPITAL_BASED_OUTPATIENT_CLINIC_OR_DEPARTMENT_OTHER): Payer: Self-pay

## 2023-10-29 ENCOUNTER — Other Ambulatory Visit (HOSPITAL_BASED_OUTPATIENT_CLINIC_OR_DEPARTMENT_OTHER): Payer: Self-pay

## 2023-10-31 ENCOUNTER — Ambulatory Visit: Payer: Medicare HMO | Admitting: *Deleted

## 2023-10-31 DIAGNOSIS — J309 Allergic rhinitis, unspecified: Secondary | ICD-10-CM

## 2023-10-31 NOTE — Progress Notes (Signed)
Immunotherapy   Patient Details  Name: Adi Tetrault MRN: [id number] Date of Birth: 1966/06/04  10/31/2023  Rob Bunting started injections for  MOLDS-DM Following schedule: A  Frequency:1 time per week Epi-Pen:Epi-Pen Available  Consent signed and patient instructions given. Patient started allergy injections today and received 0.78mL of Silver in the RUA. Patient waited 30 minutes in office and did not experience any issues.   Aymar Whitfill Fernandez-Vernon 10/31/2023, 3:11 PM

## 2023-11-06 ENCOUNTER — Other Ambulatory Visit (HOSPITAL_BASED_OUTPATIENT_CLINIC_OR_DEPARTMENT_OTHER): Payer: Self-pay

## 2023-11-07 ENCOUNTER — Ambulatory Visit (INDEPENDENT_AMBULATORY_CARE_PROVIDER_SITE_OTHER): Payer: Medicare HMO | Admitting: *Deleted

## 2023-11-07 DIAGNOSIS — J309 Allergic rhinitis, unspecified: Secondary | ICD-10-CM

## 2023-11-08 ENCOUNTER — Other Ambulatory Visit: Payer: Self-pay

## 2023-11-08 ENCOUNTER — Other Ambulatory Visit (HOSPITAL_BASED_OUTPATIENT_CLINIC_OR_DEPARTMENT_OTHER): Payer: Self-pay

## 2023-11-18 ENCOUNTER — Ambulatory Visit (INDEPENDENT_AMBULATORY_CARE_PROVIDER_SITE_OTHER): Payer: Self-pay

## 2023-11-18 DIAGNOSIS — J309 Allergic rhinitis, unspecified: Secondary | ICD-10-CM | POA: Diagnosis not present

## 2023-11-25 ENCOUNTER — Ambulatory Visit (INDEPENDENT_AMBULATORY_CARE_PROVIDER_SITE_OTHER): Payer: Self-pay | Admitting: *Deleted

## 2023-11-25 DIAGNOSIS — J309 Allergic rhinitis, unspecified: Secondary | ICD-10-CM | POA: Diagnosis not present

## 2023-11-26 ENCOUNTER — Other Ambulatory Visit (HOSPITAL_BASED_OUTPATIENT_CLINIC_OR_DEPARTMENT_OTHER): Payer: Self-pay

## 2023-11-26 MED ORDER — LORAZEPAM 2 MG PO TABS
2.0000 mg | ORAL_TABLET | Freq: Three times a day (TID) | ORAL | 0 refills | Status: DC
  Filled 2023-11-26: qty 90, 30d supply, fill #0
  Filled 2023-12-09: qty 30, 10d supply, fill #0
  Filled 2023-12-09: qty 60, 20d supply, fill #0

## 2023-11-28 ENCOUNTER — Ambulatory Visit: Payer: Medicare HMO | Admitting: Allergy & Immunology

## 2023-11-29 ENCOUNTER — Other Ambulatory Visit (HOSPITAL_BASED_OUTPATIENT_CLINIC_OR_DEPARTMENT_OTHER): Payer: Self-pay

## 2023-12-02 ENCOUNTER — Other Ambulatory Visit (HOSPITAL_BASED_OUTPATIENT_CLINIC_OR_DEPARTMENT_OTHER): Payer: Self-pay

## 2023-12-02 ENCOUNTER — Other Ambulatory Visit: Payer: Self-pay

## 2023-12-02 MED ORDER — FLUCONAZOLE 100 MG PO TABS
100.0000 mg | ORAL_TABLET | Freq: Every day | ORAL | 0 refills | Status: DC
  Filled 2023-12-02: qty 15, 15d supply, fill #0

## 2023-12-03 ENCOUNTER — Ambulatory Visit (INDEPENDENT_AMBULATORY_CARE_PROVIDER_SITE_OTHER): Payer: Medicare HMO | Admitting: *Deleted

## 2023-12-03 ENCOUNTER — Other Ambulatory Visit (HOSPITAL_BASED_OUTPATIENT_CLINIC_OR_DEPARTMENT_OTHER): Payer: Self-pay

## 2023-12-03 DIAGNOSIS — J309 Allergic rhinitis, unspecified: Secondary | ICD-10-CM | POA: Diagnosis not present

## 2023-12-06 ENCOUNTER — Other Ambulatory Visit (HOSPITAL_BASED_OUTPATIENT_CLINIC_OR_DEPARTMENT_OTHER): Payer: Self-pay

## 2023-12-09 ENCOUNTER — Ambulatory Visit (INDEPENDENT_AMBULATORY_CARE_PROVIDER_SITE_OTHER): Payer: Self-pay | Admitting: *Deleted

## 2023-12-09 ENCOUNTER — Other Ambulatory Visit (HOSPITAL_BASED_OUTPATIENT_CLINIC_OR_DEPARTMENT_OTHER): Payer: Self-pay

## 2023-12-09 ENCOUNTER — Other Ambulatory Visit: Payer: Self-pay

## 2023-12-09 DIAGNOSIS — J309 Allergic rhinitis, unspecified: Secondary | ICD-10-CM

## 2023-12-10 ENCOUNTER — Other Ambulatory Visit (HOSPITAL_BASED_OUTPATIENT_CLINIC_OR_DEPARTMENT_OTHER): Payer: Self-pay

## 2023-12-16 ENCOUNTER — Ambulatory Visit (INDEPENDENT_AMBULATORY_CARE_PROVIDER_SITE_OTHER): Payer: Self-pay

## 2023-12-16 DIAGNOSIS — J309 Allergic rhinitis, unspecified: Secondary | ICD-10-CM | POA: Diagnosis not present

## 2023-12-17 ENCOUNTER — Other Ambulatory Visit (HOSPITAL_BASED_OUTPATIENT_CLINIC_OR_DEPARTMENT_OTHER): Payer: Self-pay

## 2023-12-17 MED ORDER — FUROSEMIDE 20 MG PO TABS
20.0000 mg | ORAL_TABLET | Freq: Every day | ORAL | 1 refills | Status: AC
  Filled 2023-12-27: qty 30, 30d supply, fill #0
  Filled 2024-01-29: qty 29, 29d supply, fill #1

## 2023-12-17 MED ORDER — BUPROPION HCL ER (SR) 150 MG PO TB12
ORAL_TABLET | Freq: Two times a day (BID) | ORAL | 1 refills | Status: AC
  Filled 2023-12-17: qty 10, 5d supply, fill #0
  Filled 2023-12-23: qty 180, 90d supply, fill #1
  Filled 2023-12-27: qty 180, 90d supply, fill #2

## 2023-12-17 MED ORDER — MUPIROCIN 2 % EX OINT
TOPICAL_OINTMENT | Freq: Three times a day (TID) | CUTANEOUS | 0 refills | Status: DC
  Filled 2024-01-05: qty 22, 22d supply, fill #0
  Filled 2024-01-23: qty 22, 22d supply, fill #1
  Filled 2024-07-01: qty 22, 22d supply, fill #2

## 2023-12-17 MED ORDER — CYCLOSPORINE 0.05 % OP EMUL
1.0000 [drp] | Freq: Two times a day (BID) | OPHTHALMIC | 11 refills | Status: DC
  Filled 2024-01-05: qty 60, 30d supply, fill #0
  Filled 2024-01-31: qty 60, 30d supply, fill #1
  Filled 2024-06-12: qty 60, 30d supply, fill #2

## 2023-12-17 MED ORDER — TOPIRAMATE 50 MG PO TABS
50.0000 mg | ORAL_TABLET | Freq: Three times a day (TID) | ORAL | 1 refills | Status: AC
  Filled 2024-01-10 – 2024-01-29 (×2): qty 90, 30d supply, fill #0

## 2023-12-17 MED ORDER — FLUOXETINE HCL 10 MG PO CAPS
10.0000 mg | ORAL_CAPSULE | Freq: Every day | ORAL | 1 refills | Status: AC
  Filled 2024-01-31: qty 13, 13d supply, fill #0

## 2023-12-17 MED ORDER — HYDROCHLOROTHIAZIDE 25 MG PO TABS: 25.0000 mg | ORAL_TABLET | Freq: Every day | ORAL | 4 refills | Status: DC

## 2023-12-23 ENCOUNTER — Ambulatory Visit (INDEPENDENT_AMBULATORY_CARE_PROVIDER_SITE_OTHER): Payer: Self-pay | Admitting: *Deleted

## 2023-12-23 ENCOUNTER — Other Ambulatory Visit (HOSPITAL_BASED_OUTPATIENT_CLINIC_OR_DEPARTMENT_OTHER): Payer: Self-pay

## 2023-12-23 DIAGNOSIS — J309 Allergic rhinitis, unspecified: Secondary | ICD-10-CM | POA: Diagnosis not present

## 2023-12-24 ENCOUNTER — Other Ambulatory Visit (HOSPITAL_BASED_OUTPATIENT_CLINIC_OR_DEPARTMENT_OTHER): Payer: Self-pay

## 2023-12-24 MED ORDER — LORAZEPAM 2 MG PO TABS
2.0000 mg | ORAL_TABLET | Freq: Three times a day (TID) | ORAL | 0 refills | Status: DC
  Filled 2024-01-05: qty 90, 30d supply, fill #0

## 2023-12-27 ENCOUNTER — Other Ambulatory Visit: Payer: Self-pay

## 2023-12-27 ENCOUNTER — Other Ambulatory Visit (HOSPITAL_BASED_OUTPATIENT_CLINIC_OR_DEPARTMENT_OTHER): Payer: Self-pay

## 2023-12-27 MED ORDER — HYDROMORPHONE HCL 2 MG PO TABS
2.0000 mg | ORAL_TABLET | Freq: Three times a day (TID) | ORAL | 0 refills | Status: DC | PRN
  Filled 2023-12-27: qty 12, 4d supply, fill #0

## 2023-12-28 ENCOUNTER — Other Ambulatory Visit (HOSPITAL_BASED_OUTPATIENT_CLINIC_OR_DEPARTMENT_OTHER): Payer: Self-pay

## 2024-01-01 ENCOUNTER — Ambulatory Visit (INDEPENDENT_AMBULATORY_CARE_PROVIDER_SITE_OTHER): Payer: Self-pay | Admitting: *Deleted

## 2024-01-01 DIAGNOSIS — J309 Allergic rhinitis, unspecified: Secondary | ICD-10-CM | POA: Diagnosis not present

## 2024-01-05 ENCOUNTER — Other Ambulatory Visit: Payer: Self-pay

## 2024-01-06 ENCOUNTER — Ambulatory Visit (INDEPENDENT_AMBULATORY_CARE_PROVIDER_SITE_OTHER): Payer: Self-pay | Admitting: *Deleted

## 2024-01-06 ENCOUNTER — Other Ambulatory Visit: Payer: Self-pay

## 2024-01-06 ENCOUNTER — Other Ambulatory Visit (HOSPITAL_BASED_OUTPATIENT_CLINIC_OR_DEPARTMENT_OTHER): Payer: Self-pay

## 2024-01-06 DIAGNOSIS — J309 Allergic rhinitis, unspecified: Secondary | ICD-10-CM | POA: Diagnosis not present

## 2024-01-10 ENCOUNTER — Other Ambulatory Visit (HOSPITAL_BASED_OUTPATIENT_CLINIC_OR_DEPARTMENT_OTHER): Payer: Self-pay

## 2024-01-10 ENCOUNTER — Other Ambulatory Visit: Payer: Self-pay

## 2024-01-13 ENCOUNTER — Ambulatory Visit (INDEPENDENT_AMBULATORY_CARE_PROVIDER_SITE_OTHER): Payer: Self-pay

## 2024-01-13 DIAGNOSIS — J309 Allergic rhinitis, unspecified: Secondary | ICD-10-CM

## 2024-01-20 ENCOUNTER — Other Ambulatory Visit (HOSPITAL_BASED_OUTPATIENT_CLINIC_OR_DEPARTMENT_OTHER): Payer: Self-pay

## 2024-01-20 ENCOUNTER — Ambulatory Visit (INDEPENDENT_AMBULATORY_CARE_PROVIDER_SITE_OTHER): Payer: Self-pay | Admitting: *Deleted

## 2024-01-20 DIAGNOSIS — J309 Allergic rhinitis, unspecified: Secondary | ICD-10-CM

## 2024-01-22 ENCOUNTER — Other Ambulatory Visit (HOSPITAL_BASED_OUTPATIENT_CLINIC_OR_DEPARTMENT_OTHER): Payer: Self-pay

## 2024-01-22 MED ORDER — LORAZEPAM 2 MG PO TABS
2.0000 mg | ORAL_TABLET | Freq: Three times a day (TID) | ORAL | 0 refills | Status: DC
  Filled 2024-01-22 – 2024-02-03 (×3): qty 90, 30d supply, fill #0

## 2024-01-23 ENCOUNTER — Other Ambulatory Visit (HOSPITAL_COMMUNITY): Payer: Self-pay

## 2024-01-23 ENCOUNTER — Other Ambulatory Visit (HOSPITAL_BASED_OUTPATIENT_CLINIC_OR_DEPARTMENT_OTHER): Payer: Self-pay

## 2024-01-27 ENCOUNTER — Ambulatory Visit (INDEPENDENT_AMBULATORY_CARE_PROVIDER_SITE_OTHER): Payer: Self-pay | Admitting: *Deleted

## 2024-01-27 DIAGNOSIS — J309 Allergic rhinitis, unspecified: Secondary | ICD-10-CM

## 2024-01-30 ENCOUNTER — Other Ambulatory Visit: Payer: Self-pay

## 2024-01-30 ENCOUNTER — Other Ambulatory Visit (HOSPITAL_BASED_OUTPATIENT_CLINIC_OR_DEPARTMENT_OTHER): Payer: Self-pay

## 2024-01-31 ENCOUNTER — Other Ambulatory Visit: Payer: Self-pay

## 2024-01-31 ENCOUNTER — Other Ambulatory Visit (HOSPITAL_BASED_OUTPATIENT_CLINIC_OR_DEPARTMENT_OTHER): Payer: Self-pay

## 2024-02-03 ENCOUNTER — Other Ambulatory Visit (HOSPITAL_BASED_OUTPATIENT_CLINIC_OR_DEPARTMENT_OTHER): Payer: Self-pay

## 2024-02-03 ENCOUNTER — Other Ambulatory Visit: Payer: Self-pay

## 2024-02-04 ENCOUNTER — Ambulatory Visit: Payer: Medicare HMO | Admitting: Allergy & Immunology

## 2024-02-04 ENCOUNTER — Ambulatory Visit: Payer: Self-pay | Admitting: *Deleted

## 2024-02-04 ENCOUNTER — Other Ambulatory Visit (HOSPITAL_BASED_OUTPATIENT_CLINIC_OR_DEPARTMENT_OTHER): Payer: Self-pay

## 2024-02-04 ENCOUNTER — Encounter: Payer: Self-pay | Admitting: Allergy & Immunology

## 2024-02-04 ENCOUNTER — Other Ambulatory Visit: Payer: Self-pay

## 2024-02-04 VITALS — BP 124/88 | HR 92 | Temp 98.4°F | Resp 19

## 2024-02-04 DIAGNOSIS — J3089 Other allergic rhinitis: Secondary | ICD-10-CM

## 2024-02-04 DIAGNOSIS — J309 Allergic rhinitis, unspecified: Secondary | ICD-10-CM

## 2024-02-04 DIAGNOSIS — J4530 Mild persistent asthma, uncomplicated: Secondary | ICD-10-CM

## 2024-02-04 MED ORDER — BUDESONIDE-FORMOTEROL FUMARATE 80-4.5 MCG/ACT IN AERO
2.0000 | INHALATION_SPRAY | Freq: Two times a day (BID) | RESPIRATORY_TRACT | 5 refills | Status: DC
  Filled 2024-02-04: qty 10.2, 30d supply, fill #0

## 2024-02-04 MED ORDER — CLOBETASOL PROPIONATE 0.05 % EX SOLN
1.0000 | Freq: Two times a day (BID) | CUTANEOUS | 5 refills | Status: AC
  Filled 2024-02-04: qty 50, 30d supply, fill #0
  Filled 2024-04-02: qty 50, 60d supply, fill #1
  Filled 2024-04-02: qty 50, 30d supply, fill #1
  Filled 2024-07-17: qty 50, 60d supply, fill #2
  Filled 2024-09-28: qty 50, 60d supply, fill #3

## 2024-02-04 MED ORDER — EPINASTINE HCL 0.05 % OP SOLN
2.0000 [drp] | Freq: Two times a day (BID) | OPHTHALMIC | 12 refills | Status: AC
  Filled 2024-02-04: qty 10, 25d supply, fill #0
  Filled 2024-03-23 (×2): qty 10, 25d supply, fill #1
  Filled 2024-04-20: qty 10, 25d supply, fill #2
  Filled 2024-06-12: qty 10, 25d supply, fill #3
  Filled 2024-07-17: qty 10, 25d supply, fill #4
  Filled 2024-08-10: qty 10, 25d supply, fill #5
  Filled 2024-09-21: qty 10, 25d supply, fill #6
  Filled 2024-10-14: qty 10, 25d supply, fill #7
  Filled 2024-11-06: qty 10, 25d supply, fill #8
  Filled 2024-12-05: qty 10, 25d supply, fill #9

## 2024-02-04 NOTE — [place] Notes (Signed)
 FOLLOW UP  Date of Service/Encounter:  02/04/24   Assessment:   Perennial allergic rhinitis (indoor molds and dust mites) - doing better on allergen immunotherapy  Multiple chemical sensitivity - recommended avoidance and initiation of Symbicort   Pruritus    Dry mouth secondary to antihistamines   Complicated past medical history, including anxiety and depression  Plan/Recommendations:   1. Perennial allergic rhinitis (dust mites, molds) - Previous testing showed: indoor molds and dust mites - Continue with allergy shots at the same schedule.  - Continue with: Claritin (loratadine) 10mg  tablet once daily and Flonase (fluticasone) one spray per nostril daily (AIM FOR EAR ON EACH SIDE) - Start taking: Elestat one drop per eye twice daily (this appears to be covered on the formulary).  - You can use an extra dose of the antihistamine, if needed, for breakthrough symptoms.  - Consider nasal saline rinses 1-2 times daily to remove allergens from the nasal cavities as well as help with mucous clearance (this is especially helpful to do before the nasal sprays are given).  2. Shortness of breath/sensitivity to scents - Lung testing looks good today which is GREAT NEWS!  - Levels were over 100%!  - Unfortunately, there is not much that we can do with sensitivity to scents/fumes aside from avoidance. - We can start you on a daily controller medication that contains a long-acting albuterol AND an inhaled steroid to help with inflammation in the lungs (Symbicort). - Try getting this portable air purifier to see if this can help: https://respiray.com - Daily controller medication(s): Symbicort 80/4.28mcg two puffs twice daily with spacer - Prior to physical activity: albuterol 2 puffs 10-15 minutes before physical activity. - Rescue medications: albuterol 4 puffs every 4-6 hours as needed - Asthma control goals:  * Full participation in all desired activities (may need albuterol before  activity) * Albuterol use two time or less a week on average (not counting use with activity) * Cough interfering with sleep two time or less a month * Oral steroids no more than once a year * No hospitalizations  3. Itching - Continue with the Claritin daily to help with itching.  - Add on clobetasol shampoo to help with the scalp itching.  - Consider the addition of Nemluvio injections. - Check it out and see what you think: [email or web address]://www.nemluvio.com  4. Return in about 3 months (around 05/06/2024). You can have the follow up appointment with Dr. Dellis Anes or a Nurse Practicioner (our Nurse Practitioners are excellent and always have Physician oversight!).   Subjective:   Michele [name] is a 58 y.o. female presenting today for follow up of  Chief Complaint  Patient presents with   Follow-up    Dust allergies is better due to environment change. Chemical allergens still continue to bother her--smells of detergents, fabrics.    Michele [name] has a history of the following: Patient Active Problem List   Diagnosis Date Noted   Chronic pruritus 08/06/2022   Nonallergic rhinitis 08/06/2022   Osteoma of skull 05/27/2017   Abdominal bloating 08/26/2015   Vaginitis and vulvovaginitis 03/01/2010   HYPERCHOLESTEROLEMIA 01/02/2010   DEPRESSION 09/07/2009   PELVIC  PAIN 05/02/2009   Candidiasis 04/22/2009   VULVODYNIA UNSPECIFIED 04/22/2009   Anxiety state 10/11/2008   UTI'S, RECURRENT 10/11/2008   BREAST MASS, RIGHT 07/29/2008   ACNE NEC 08/14/2007   History of urinary tract infection 08/14/2007    History obtained from: chart review and patient and her sister.  Discussed the use of AI  scribe software for clinical note transcription with the patient and/or guardian, who gave verbal consent to proceed.  Michele [name] is a 58 y.o. female presenting for a follow up visit.  She was last seen in November 2024.  At that time, she had testing that was positive to indoor molds and dust mites.  We  continue with Claritin as well as Flonase.  She did decide to start allergen immunotherapy.  Since the last visit, she has mostly done better. She is now living on her own instead of with her mother.   She experiences significant sensitivity to various chemicals and environmental allergens, which has severely impacted her life. Symptoms include itching of the eyes, nose, and throat, tingling in the head, and difficulty breathing when exposed to certain scents and chemicals. She reacts to detergents, air fresheners, and new furniture. She has never needed any albuterol in the past and has never been on a daily controller medication.   She has a history of allergies to dust and mold, identified through previous testing. Despite moving out of her mother's house to better control her environment, she continues to experience severe reactions to various chemicals and fabrics in her new home. An incident involved returning groceries due to the overwhelming smell of wipes she purchased.  She uses albuterol as needed, providing minimal relief, and Pataday for her eyes, which helps slightly. She seeks stronger medication due to the severity of her symptoms and has an Epipen for emergencies. She inquires about other medications to manage her symptoms.  Her sister notes that she cannot live a normal life due to these sensitivities, having to return furniture and avoid certain environments to prevent reactions. She has tried various methods to mitigate her symptoms, including using air purifiers and keeping windows open, but continues to struggle with daily activities.     Michele [name] is on allergen immunotherapy. She receives one injection. Immunotherapy script #1 contains molds and dust mites. She currently receives 0.49mL of the BLUE vial (1/100,000). She started shots December of 2025 and not yet reached maintenance. She started on the Silver Vial (1:1,000,000) due to concern that she might have some kind of reaction.    Otherwise, there have been no changes to her past medical history, surgical history, family history, or social history.    Review of systems otherwise negative other than that mentioned in the HPI.    Objective:   Blood pressure 124/88, pulse 92, temperature 98.4 F (36.9 C), temperature source Temporal, resp. rate 19, SpO2 99%. There is no height or weight on file to calculate BMI.    Physical Exam Vitals reviewed.  Constitutional:      Appearance: She is well-developed.     Comments: Very anxious.   HENT:     Head: Normocephalic and atraumatic.     Right Ear: Tympanic membrane, ear canal and external ear normal. No drainage, swelling or tenderness. Tympanic membrane is not injected, scarred, erythematous, retracted or bulging.     Left Ear: Tympanic membrane, ear canal and external ear normal. No drainage, swelling or tenderness. Tympanic membrane is not injected, scarred, erythematous, retracted or bulging.     Nose: No nasal deformity, septal deviation, mucosal edema or rhinorrhea.     Right Turbinates: Swollen and pale. Not enlarged.     Left Turbinates: Swollen and pale. Not enlarged.     Right Sinus: No maxillary sinus tenderness or frontal sinus tenderness.     Left Sinus: No maxillary sinus tenderness or frontal sinus tenderness.  Mouth/Throat:     Lips: Pink.     Mouth: Mucous membranes are moist. Mucous membranes are not pale and not dry.     Pharynx: Uvula midline.  Eyes:     General:        Right eye: No discharge.        Left eye: No discharge.     Conjunctiva/sclera: Conjunctivae normal.     Right eye: Right conjunctiva is not injected. No chemosis.    Left eye: Left conjunctiva is not injected. No chemosis.    Pupils: Pupils are equal, round, and reactive to light.  Cardiovascular:     Rate and Rhythm: Normal rate and regular rhythm.     Heart sounds: Normal heart sounds.  Pulmonary:     Effort: Pulmonary effort is normal. No tachypnea, accessory  muscle usage or respiratory distress.     Breath sounds: Normal breath sounds. No wheezing, rhonchi or rales.  Chest:     Chest wall: No tenderness.  Abdominal:     Tenderness: There is no abdominal tenderness. There is no guarding or rebound.  Lymphadenopathy:     Head:     Right side of head: No submandibular, tonsillar or occipital adenopathy.     Left side of head: No submandibular, tonsillar or occipital adenopathy.     Cervical: No cervical adenopathy.  Skin:    General: Skin is warm.     Capillary Refill: Capillary refill takes less than 2 seconds.     Coloration: Skin is not pale.     Findings: No abrasion, erythema, petechiae or rash. Rash is not papular, urticarial or vesicular.     Comments: Excoriations present.   Neurological:     Mental Status: She is alert.  Psychiatric:        Behavior: Behavior is cooperative.      Diagnostic studies:    Spirometry: results normal (FEV1: 2.18/105%, FVC: 2.84/110%, FEV1/FVC: 77%).    Spirometry consistent with normal pattern.   Allergy Studies: none       Malachi Bonds, MD  [facility]

## 2024-02-04 NOTE — Patient Instructions (Addendum)
 1. Perennial allergic rhinitis (dust mites, molds) - Previous testing showed: indoor molds and dust mites - Continue with allergy shots at the same schedule.  - Continue with: Claritin (loratadine) 10mg  tablet once daily and Flonase (fluticasone) one spray per nostril daily (AIM FOR EAR ON EACH SIDE) - Start taking: Elestat one drop per eye twice daily (this appears to be covered on the formulary).  - You can use an extra dose of the antihistamine, if needed, for breakthrough symptoms.  - Consider nasal saline rinses 1-2 times daily to remove allergens from the nasal cavities as well as help with mucous clearance (this is especially helpful to do before the nasal sprays are given).  2. Shortness of breath/sensitivity to scents - Lung testing looks good today which is GREAT NEWS!  - Levels were over 100%!  - Unfortunately, there is not much that we can do with sensitivity to scents/fumes aside from avoidance. - We can start you on a daily controller medication that contains a long-acting albuterol AND an inhaled steroid to help with inflammation in the lungs (Symbicort). - Try getting this portable air purifier to see if this can help: https://respiray.com - Daily controller medication(s): Symbicort 80/4.23mcg two puffs twice daily with spacer - Prior to physical activity: albuterol 2 puffs 10-15 minutes before physical activity. - Rescue medications: albuterol 4 puffs every 4-6 hours as needed - Asthma control goals:  * Full participation in all desired activities (may need albuterol before activity) * Albuterol use two time or less a week on average (not counting use with activity) * Cough interfering with sleep two time or less a month * Oral steroids no more than once a year * No hospitalizations  3. Itching - Continue with the Claritin daily to help with itching.  - Add on clobetasol shampoo to help with the scalp itching.  - Consider the addition of Nemluvio injections. - Check it out  and see what you think: [email or web address]://www.nemluvio.com  4. Return in about 3 months (around 05/06/2024). You can have the follow up appointment with Dr. Dellis Anes or a Nurse Practicioner (our Nurse Practitioners are excellent and always have Physician oversight!).    Please inform us of any Emergency Department visits, hospitalizations, or changes in symptoms. Call us before going to the ED for breathing or allergy symptoms since we might be able to fit you in for a sick visit. Feel free to contact us anytime with any questions, problems, or concerns.  It was a pleasure to see you again today!  Websites that have reliable patient information: 1. American Academy of Asthma, Allergy, and Immunology: www.aaaai.org 2. Food Allergy Research and Education (FARE): foodallergy.org 3. Mothers of Asthmatics: http://www.asthmacommunitynetwork.org 4. American College of Allergy, Asthma, and Immunology: www.acaai.org      "Like" [place] on Facebook and Instagram for our latest updates!      A healthy democracy works best when [employer] participate! Make sure you are registered to vote! If you have moved or changed any of your contact information, you will need to get this updated before voting! Scan the QR codes below to learn more!

## 2024-02-05 ENCOUNTER — Other Ambulatory Visit: Payer: Self-pay

## 2024-02-05 ENCOUNTER — Other Ambulatory Visit (HOSPITAL_BASED_OUTPATIENT_CLINIC_OR_DEPARTMENT_OTHER): Payer: Self-pay

## 2024-02-06 ENCOUNTER — Other Ambulatory Visit (HOSPITAL_BASED_OUTPATIENT_CLINIC_OR_DEPARTMENT_OTHER): Payer: Self-pay

## 2024-02-10 ENCOUNTER — Ambulatory Visit (INDEPENDENT_AMBULATORY_CARE_PROVIDER_SITE_OTHER): Payer: Self-pay | Admitting: *Deleted

## 2024-02-10 DIAGNOSIS — J309 Allergic rhinitis, unspecified: Secondary | ICD-10-CM | POA: Diagnosis not present

## 2024-02-13 ENCOUNTER — Other Ambulatory Visit (HOSPITAL_BASED_OUTPATIENT_CLINIC_OR_DEPARTMENT_OTHER): Payer: Self-pay

## 2024-02-14 ENCOUNTER — Encounter: Payer: Self-pay | Admitting: Allergy & Immunology

## 2024-02-17 ENCOUNTER — Ambulatory Visit (INDEPENDENT_AMBULATORY_CARE_PROVIDER_SITE_OTHER): Payer: Self-pay

## 2024-02-17 DIAGNOSIS — J309 Allergic rhinitis, unspecified: Secondary | ICD-10-CM | POA: Diagnosis not present

## 2024-02-19 ENCOUNTER — Other Ambulatory Visit (HOSPITAL_BASED_OUTPATIENT_CLINIC_OR_DEPARTMENT_OTHER): Payer: Self-pay

## 2024-02-19 ENCOUNTER — Other Ambulatory Visit: Payer: Self-pay

## 2024-02-19 MED ORDER — LORAZEPAM 2 MG PO TABS
2.0000 mg | ORAL_TABLET | Freq: Three times a day (TID) | ORAL | 0 refills | Status: DC
Start: 2024-02-19 — End: 2024-03-20
  Filled 2024-03-05: qty 90, 30d supply, fill #0

## 2024-02-19 MED ORDER — HYDROMORPHONE HCL 2 MG PO TABS
2.0000 mg | ORAL_TABLET | Freq: Three times a day (TID) | ORAL | 0 refills | Status: AC | PRN
  Filled 2024-02-19: qty 90, 30d supply, fill #0

## 2024-02-20 ENCOUNTER — Other Ambulatory Visit (HOSPITAL_BASED_OUTPATIENT_CLINIC_OR_DEPARTMENT_OTHER): Payer: Self-pay

## 2024-02-20 MED ORDER — FLUOROMETHOLONE 0.1 % OP SUSP
1.0000 [drp] | Freq: Two times a day (BID) | OPHTHALMIC | 0 refills | Status: AC
  Filled 2024-02-20: qty 5, 14d supply, fill #0

## 2024-02-24 ENCOUNTER — Ambulatory Visit (INDEPENDENT_AMBULATORY_CARE_PROVIDER_SITE_OTHER): Admitting: *Deleted

## 2024-02-24 DIAGNOSIS — J309 Allergic rhinitis, unspecified: Secondary | ICD-10-CM | POA: Diagnosis not present

## 2024-03-03 ENCOUNTER — Ambulatory Visit (INDEPENDENT_AMBULATORY_CARE_PROVIDER_SITE_OTHER): Payer: Self-pay

## 2024-03-03 ENCOUNTER — Other Ambulatory Visit (HOSPITAL_BASED_OUTPATIENT_CLINIC_OR_DEPARTMENT_OTHER): Payer: Self-pay

## 2024-03-03 DIAGNOSIS — J309 Allergic rhinitis, unspecified: Secondary | ICD-10-CM

## 2024-03-04 ENCOUNTER — Other Ambulatory Visit (HOSPITAL_BASED_OUTPATIENT_CLINIC_OR_DEPARTMENT_OTHER): Payer: Self-pay

## 2024-03-05 ENCOUNTER — Other Ambulatory Visit: Payer: Self-pay

## 2024-03-05 ENCOUNTER — Other Ambulatory Visit (HOSPITAL_BASED_OUTPATIENT_CLINIC_OR_DEPARTMENT_OTHER): Payer: Self-pay

## 2024-03-05 MED ORDER — FLUCONAZOLE 200 MG PO TABS
200.0000 mg | ORAL_TABLET | ORAL | 1 refills | Status: AC
  Filled 2024-03-05: qty 4, 8d supply, fill #0
  Filled 2024-04-02: qty 4, 8d supply, fill #1

## 2024-03-10 ENCOUNTER — Ambulatory Visit (INDEPENDENT_AMBULATORY_CARE_PROVIDER_SITE_OTHER): Payer: Self-pay

## 2024-03-10 DIAGNOSIS — J309 Allergic rhinitis, unspecified: Secondary | ICD-10-CM

## 2024-03-16 ENCOUNTER — Ambulatory Visit (INDEPENDENT_AMBULATORY_CARE_PROVIDER_SITE_OTHER): Payer: Self-pay

## 2024-03-16 DIAGNOSIS — J309 Allergic rhinitis, unspecified: Secondary | ICD-10-CM

## 2024-03-20 ENCOUNTER — Other Ambulatory Visit (HOSPITAL_BASED_OUTPATIENT_CLINIC_OR_DEPARTMENT_OTHER): Payer: Self-pay

## 2024-03-20 MED ORDER — LORAZEPAM 2 MG PO TABS
2.0000 mg | ORAL_TABLET | Freq: Three times a day (TID) | ORAL | 0 refills | Status: AC
  Filled 2024-04-02 – 2024-04-04 (×2): qty 90, 30d supply, fill #0

## 2024-03-23 ENCOUNTER — Ambulatory Visit (INDEPENDENT_AMBULATORY_CARE_PROVIDER_SITE_OTHER): Payer: Self-pay

## 2024-03-23 ENCOUNTER — Other Ambulatory Visit (HOSPITAL_BASED_OUTPATIENT_CLINIC_OR_DEPARTMENT_OTHER): Payer: Self-pay

## 2024-03-23 DIAGNOSIS — J309 Allergic rhinitis, unspecified: Secondary | ICD-10-CM

## 2024-03-23 MED ORDER — BUPROPION HCL ER (SR) 100 MG PO TB12
100.0000 mg | ORAL_TABLET | Freq: Two times a day (BID) | ORAL | 1 refills | Status: AC
  Filled 2024-03-23: qty 180, 90d supply, fill #0
  Filled 2024-06-17: qty 180, 90d supply, fill #1

## 2024-03-23 MED ORDER — BUPROPION HCL ER (SR) 150 MG PO TB12
150.0000 mg | ORAL_TABLET | Freq: Two times a day (BID) | ORAL | 1 refills | Status: DC
  Filled 2024-03-23: qty 180, 90d supply, fill #0
  Filled 2024-07-17 – 2024-08-10 (×2): qty 180, 90d supply, fill #1

## 2024-03-24 ENCOUNTER — Other Ambulatory Visit (HOSPITAL_BASED_OUTPATIENT_CLINIC_OR_DEPARTMENT_OTHER): Payer: Self-pay

## 2024-03-30 ENCOUNTER — Ambulatory Visit (INDEPENDENT_AMBULATORY_CARE_PROVIDER_SITE_OTHER): Payer: Self-pay

## 2024-03-30 DIAGNOSIS — J309 Allergic rhinitis, unspecified: Secondary | ICD-10-CM | POA: Diagnosis not present

## 2024-04-02 ENCOUNTER — Other Ambulatory Visit (HOSPITAL_COMMUNITY): Payer: Self-pay

## 2024-04-02 ENCOUNTER — Other Ambulatory Visit (HOSPITAL_BASED_OUTPATIENT_CLINIC_OR_DEPARTMENT_OTHER): Payer: Self-pay

## 2024-04-04 ENCOUNTER — Other Ambulatory Visit (HOSPITAL_BASED_OUTPATIENT_CLINIC_OR_DEPARTMENT_OTHER): Payer: Self-pay

## 2024-04-06 ENCOUNTER — Ambulatory Visit (INDEPENDENT_AMBULATORY_CARE_PROVIDER_SITE_OTHER): Payer: Self-pay

## 2024-04-06 DIAGNOSIS — J309 Allergic rhinitis, unspecified: Secondary | ICD-10-CM | POA: Diagnosis not present

## 2024-04-07 ENCOUNTER — Other Ambulatory Visit (HOSPITAL_BASED_OUTPATIENT_CLINIC_OR_DEPARTMENT_OTHER): Payer: Self-pay

## 2024-04-11 ENCOUNTER — Other Ambulatory Visit (HOSPITAL_BASED_OUTPATIENT_CLINIC_OR_DEPARTMENT_OTHER): Payer: Self-pay

## 2024-04-13 ENCOUNTER — Ambulatory Visit (INDEPENDENT_AMBULATORY_CARE_PROVIDER_SITE_OTHER): Payer: Self-pay

## 2024-04-13 DIAGNOSIS — J309 Allergic rhinitis, unspecified: Secondary | ICD-10-CM | POA: Diagnosis not present

## 2024-04-15 ENCOUNTER — Other Ambulatory Visit (HOSPITAL_BASED_OUTPATIENT_CLINIC_OR_DEPARTMENT_OTHER): Payer: Self-pay

## 2024-04-20 ENCOUNTER — Other Ambulatory Visit (HOSPITAL_BASED_OUTPATIENT_CLINIC_OR_DEPARTMENT_OTHER): Payer: Self-pay

## 2024-04-20 MED ORDER — LORAZEPAM 2 MG PO TABS
2.0000 mg | ORAL_TABLET | Freq: Three times a day (TID) | ORAL | 0 refills | Status: DC
  Filled 2024-05-02: qty 90, 30d supply, fill #0

## 2024-04-21 ENCOUNTER — Other Ambulatory Visit (HOSPITAL_BASED_OUTPATIENT_CLINIC_OR_DEPARTMENT_OTHER): Payer: Self-pay

## 2024-04-22 ENCOUNTER — Ambulatory Visit (INDEPENDENT_AMBULATORY_CARE_PROVIDER_SITE_OTHER): Payer: Self-pay

## 2024-04-22 ENCOUNTER — Other Ambulatory Visit (HOSPITAL_BASED_OUTPATIENT_CLINIC_OR_DEPARTMENT_OTHER): Payer: Self-pay

## 2024-04-22 DIAGNOSIS — J309 Allergic rhinitis, unspecified: Secondary | ICD-10-CM | POA: Diagnosis not present

## 2024-04-29 ENCOUNTER — Ambulatory Visit (INDEPENDENT_AMBULATORY_CARE_PROVIDER_SITE_OTHER): Payer: Self-pay

## 2024-04-29 DIAGNOSIS — J309 Allergic rhinitis, unspecified: Secondary | ICD-10-CM | POA: Diagnosis not present

## 2024-04-30 ENCOUNTER — Other Ambulatory Visit (HOSPITAL_BASED_OUTPATIENT_CLINIC_OR_DEPARTMENT_OTHER): Payer: Self-pay

## 2024-05-02 ENCOUNTER — Other Ambulatory Visit (HOSPITAL_BASED_OUTPATIENT_CLINIC_OR_DEPARTMENT_OTHER): Payer: Self-pay

## 2024-05-05 ENCOUNTER — Ambulatory Visit (INDEPENDENT_AMBULATORY_CARE_PROVIDER_SITE_OTHER): Payer: Self-pay

## 2024-05-05 DIAGNOSIS — J309 Allergic rhinitis, unspecified: Secondary | ICD-10-CM | POA: Diagnosis not present

## 2024-05-11 ENCOUNTER — Ambulatory Visit (INDEPENDENT_AMBULATORY_CARE_PROVIDER_SITE_OTHER): Payer: Self-pay

## 2024-05-11 DIAGNOSIS — J309 Allergic rhinitis, unspecified: Secondary | ICD-10-CM | POA: Diagnosis not present

## 2024-05-13 ENCOUNTER — Other Ambulatory Visit (HOSPITAL_BASED_OUTPATIENT_CLINIC_OR_DEPARTMENT_OTHER): Payer: Self-pay

## 2024-05-18 ENCOUNTER — Ambulatory Visit (INDEPENDENT_AMBULATORY_CARE_PROVIDER_SITE_OTHER): Payer: Self-pay

## 2024-05-18 DIAGNOSIS — J309 Allergic rhinitis, unspecified: Secondary | ICD-10-CM | POA: Diagnosis not present

## 2024-05-19 ENCOUNTER — Other Ambulatory Visit (HOSPITAL_BASED_OUTPATIENT_CLINIC_OR_DEPARTMENT_OTHER): Payer: Self-pay

## 2024-05-19 MED ORDER — LORAZEPAM 2 MG PO TABS
2.0000 mg | ORAL_TABLET | Freq: Three times a day (TID) | ORAL | 0 refills | Status: DC
  Filled 2024-06-01: qty 90, 30d supply, fill #0

## 2024-05-22 ENCOUNTER — Other Ambulatory Visit (HOSPITAL_BASED_OUTPATIENT_CLINIC_OR_DEPARTMENT_OTHER): Payer: Self-pay

## 2024-05-22 MED ORDER — GABAPENTIN 300 MG PO CAPS
600.0000 mg | ORAL_CAPSULE | Freq: Every day | ORAL | 6 refills | Status: AC
  Filled 2024-05-22: qty 360, 30d supply, fill #0

## 2024-05-25 ENCOUNTER — Ambulatory Visit (INDEPENDENT_AMBULATORY_CARE_PROVIDER_SITE_OTHER)

## 2024-05-25 DIAGNOSIS — J309 Allergic rhinitis, unspecified: Secondary | ICD-10-CM | POA: Diagnosis not present

## 2024-06-01 ENCOUNTER — Other Ambulatory Visit (HOSPITAL_BASED_OUTPATIENT_CLINIC_OR_DEPARTMENT_OTHER): Payer: Self-pay

## 2024-06-01 ENCOUNTER — Ambulatory Visit (INDEPENDENT_AMBULATORY_CARE_PROVIDER_SITE_OTHER)

## 2024-06-01 DIAGNOSIS — J309 Allergic rhinitis, unspecified: Secondary | ICD-10-CM | POA: Diagnosis not present

## 2024-06-02 ENCOUNTER — Other Ambulatory Visit (HOSPITAL_BASED_OUTPATIENT_CLINIC_OR_DEPARTMENT_OTHER): Payer: Self-pay

## 2024-06-02 ENCOUNTER — Other Ambulatory Visit: Payer: Self-pay

## 2024-06-04 ENCOUNTER — Other Ambulatory Visit (HOSPITAL_BASED_OUTPATIENT_CLINIC_OR_DEPARTMENT_OTHER): Payer: Self-pay

## 2024-06-08 ENCOUNTER — Ambulatory Visit (INDEPENDENT_AMBULATORY_CARE_PROVIDER_SITE_OTHER)

## 2024-06-08 DIAGNOSIS — J309 Allergic rhinitis, unspecified: Secondary | ICD-10-CM

## 2024-06-13 ENCOUNTER — Other Ambulatory Visit (HOSPITAL_BASED_OUTPATIENT_CLINIC_OR_DEPARTMENT_OTHER): Payer: Self-pay

## 2024-06-15 ENCOUNTER — Other Ambulatory Visit (HOSPITAL_BASED_OUTPATIENT_CLINIC_OR_DEPARTMENT_OTHER): Payer: Self-pay

## 2024-06-15 ENCOUNTER — Ambulatory Visit (INDEPENDENT_AMBULATORY_CARE_PROVIDER_SITE_OTHER)

## 2024-06-15 DIAGNOSIS — J309 Allergic rhinitis, unspecified: Secondary | ICD-10-CM | POA: Diagnosis not present

## 2024-06-16 ENCOUNTER — Other Ambulatory Visit (HOSPITAL_BASED_OUTPATIENT_CLINIC_OR_DEPARTMENT_OTHER): Payer: Self-pay

## 2024-06-17 ENCOUNTER — Other Ambulatory Visit (HOSPITAL_BASED_OUTPATIENT_CLINIC_OR_DEPARTMENT_OTHER): Payer: Self-pay

## 2024-06-17 ENCOUNTER — Other Ambulatory Visit (HOSPITAL_COMMUNITY): Payer: Self-pay

## 2024-06-18 ENCOUNTER — Other Ambulatory Visit (HOSPITAL_COMMUNITY): Payer: Self-pay

## 2024-06-18 ENCOUNTER — Encounter (HOSPITAL_BASED_OUTPATIENT_CLINIC_OR_DEPARTMENT_OTHER): Payer: Self-pay

## 2024-06-18 ENCOUNTER — Other Ambulatory Visit (HOSPITAL_BASED_OUTPATIENT_CLINIC_OR_DEPARTMENT_OTHER): Payer: Self-pay

## 2024-06-18 MED ORDER — GABAPENTIN 300 MG PO CAPS
600.0000 mg | ORAL_CAPSULE | Freq: Every day | ORAL | 6 refills | Status: AC
  Filled 2024-06-18 – 2024-12-12 (×5): qty 360, 30d supply, fill #0

## 2024-06-19 ENCOUNTER — Other Ambulatory Visit (HOSPITAL_BASED_OUTPATIENT_CLINIC_OR_DEPARTMENT_OTHER): Payer: Self-pay

## 2024-06-19 MED ORDER — LORAZEPAM 2 MG PO TABS
2.0000 mg | ORAL_TABLET | Freq: Three times a day (TID) | ORAL | 0 refills | Status: DC
  Filled 2024-07-29 – 2024-08-01 (×2): qty 90, 30d supply, fill #0

## 2024-06-19 MED ORDER — LORAZEPAM 2 MG PO TABS
2.0000 mg | ORAL_TABLET | Freq: Three times a day (TID) | ORAL | 0 refills | Status: AC
  Filled 2024-07-01: qty 90, 30d supply, fill #0

## 2024-06-20 ENCOUNTER — Other Ambulatory Visit (HOSPITAL_BASED_OUTPATIENT_CLINIC_OR_DEPARTMENT_OTHER): Payer: Self-pay

## 2024-06-22 ENCOUNTER — Ambulatory Visit (INDEPENDENT_AMBULATORY_CARE_PROVIDER_SITE_OTHER)

## 2024-06-22 DIAGNOSIS — J309 Allergic rhinitis, unspecified: Secondary | ICD-10-CM | POA: Diagnosis not present

## 2024-06-29 ENCOUNTER — Ambulatory Visit (INDEPENDENT_AMBULATORY_CARE_PROVIDER_SITE_OTHER)

## 2024-06-29 DIAGNOSIS — J309 Allergic rhinitis, unspecified: Secondary | ICD-10-CM

## 2024-07-01 ENCOUNTER — Other Ambulatory Visit (HOSPITAL_BASED_OUTPATIENT_CLINIC_OR_DEPARTMENT_OTHER): Payer: Self-pay

## 2024-07-01 ENCOUNTER — Other Ambulatory Visit: Payer: Self-pay

## 2024-07-02 ENCOUNTER — Other Ambulatory Visit (HOSPITAL_BASED_OUTPATIENT_CLINIC_OR_DEPARTMENT_OTHER): Payer: Self-pay

## 2024-07-06 ENCOUNTER — Ambulatory Visit (INDEPENDENT_AMBULATORY_CARE_PROVIDER_SITE_OTHER)

## 2024-07-06 DIAGNOSIS — J309 Allergic rhinitis, unspecified: Secondary | ICD-10-CM | POA: Diagnosis not present

## 2024-07-16 ENCOUNTER — Ambulatory Visit (INDEPENDENT_AMBULATORY_CARE_PROVIDER_SITE_OTHER)

## 2024-07-16 DIAGNOSIS — J309 Allergic rhinitis, unspecified: Secondary | ICD-10-CM

## 2024-07-17 ENCOUNTER — Other Ambulatory Visit: Payer: Self-pay

## 2024-07-17 ENCOUNTER — Other Ambulatory Visit (HOSPITAL_BASED_OUTPATIENT_CLINIC_OR_DEPARTMENT_OTHER): Payer: Self-pay

## 2024-07-17 MED ORDER — CYCLOSPORINE 0.05 % OP EMUL
1.0000 [drp] | Freq: Two times a day (BID) | OPHTHALMIC | 3 refills | Status: AC
  Filled 2024-07-17: qty 180, 90d supply, fill #0
  Filled 2024-09-21: qty 180, 90d supply, fill #1
  Filled 2024-11-06: qty 180, 90d supply, fill #2

## 2024-07-17 MED ORDER — ALBUTEROL SULFATE HFA 108 (90 BASE) MCG/ACT IN AERS
1.0000 | INHALATION_SPRAY | Freq: Four times a day (QID) | RESPIRATORY_TRACT | 2 refills | Status: DC
  Filled 2024-07-17: qty 6.7, 25d supply, fill #0
  Filled 2024-08-10: qty 6.7, 25d supply, fill #1
  Filled 2024-09-28: qty 6.7, 25d supply, fill #2

## 2024-07-17 MED ORDER — FLUCONAZOLE 200 MG PO TABS
200.0000 mg | ORAL_TABLET | ORAL | 1 refills | Status: DC
  Filled 2024-07-17: qty 4, 8d supply, fill #0
  Filled 2024-08-10: qty 4, 8d supply, fill #1

## 2024-07-22 ENCOUNTER — Ambulatory Visit (INDEPENDENT_AMBULATORY_CARE_PROVIDER_SITE_OTHER)

## 2024-07-22 DIAGNOSIS — J309 Allergic rhinitis, unspecified: Secondary | ICD-10-CM

## 2024-07-28 ENCOUNTER — Other Ambulatory Visit (HOSPITAL_BASED_OUTPATIENT_CLINIC_OR_DEPARTMENT_OTHER): Payer: Self-pay

## 2024-07-29 ENCOUNTER — Other Ambulatory Visit (HOSPITAL_BASED_OUTPATIENT_CLINIC_OR_DEPARTMENT_OTHER): Payer: Self-pay

## 2024-07-30 ENCOUNTER — Other Ambulatory Visit (HOSPITAL_BASED_OUTPATIENT_CLINIC_OR_DEPARTMENT_OTHER): Payer: Self-pay

## 2024-08-01 ENCOUNTER — Other Ambulatory Visit (HOSPITAL_BASED_OUTPATIENT_CLINIC_OR_DEPARTMENT_OTHER): Payer: Self-pay

## 2024-08-04 ENCOUNTER — Encounter: Payer: Self-pay | Admitting: Allergy & Immunology

## 2024-08-04 ENCOUNTER — Ambulatory Visit (INDEPENDENT_AMBULATORY_CARE_PROVIDER_SITE_OTHER): Admitting: Allergy & Immunology

## 2024-08-04 ENCOUNTER — Other Ambulatory Visit: Payer: Self-pay

## 2024-08-04 VITALS — BP 138/100 | HR 87 | Temp 98.5°F | Resp 16 | Ht 59.0 in | Wt 129.7 lb

## 2024-08-04 DIAGNOSIS — J4530 Mild persistent asthma, uncomplicated: Secondary | ICD-10-CM

## 2024-08-04 DIAGNOSIS — T7840XD Allergy, unspecified, subsequent encounter: Secondary | ICD-10-CM

## 2024-08-04 DIAGNOSIS — L299 Pruritus, unspecified: Secondary | ICD-10-CM | POA: Diagnosis not present

## 2024-08-04 DIAGNOSIS — D899 Disorder involving the immune mechanism, unspecified: Secondary | ICD-10-CM | POA: Diagnosis not present

## 2024-08-04 DIAGNOSIS — J3089 Other allergic rhinitis: Secondary | ICD-10-CM

## 2024-08-04 MED ORDER — ALCAFTADINE 0.25 % OP SOLN
1.0000 [drp] | Freq: Every day | OPHTHALMIC | 5 refills | Status: AC
  Filled 2024-09-03: qty 5, 50d supply, fill #0

## 2024-08-04 MED ORDER — TOBRAMYCIN 0.3 % OP SOLN: 1.0000 [drp] | Freq: Four times a day (QID) | OPHTHALMIC | 0 refills | Status: AC

## 2024-08-04 MED ORDER — TRIAMCINOLONE ACETONIDE 0.1 % EX CREA: 1.0000 | TOPICAL_CREAM | Freq: Two times a day (BID) | CUTANEOUS | 1 refills | Status: AC

## 2024-08-04 NOTE — Patient Instructions (Addendum)
 1. Perennial allergic rhinitis (dust mites, molds) - Previous testing showed: indoor molds and dust mites - Continue with allergy  shots at the same schedule.  - Continue with: Claritin  (loratadine ) 10mg  tablet once daily and Flonase  (fluticasone ) one spray per nostril daily (AIM FOR EAR ON EACH SIDE) - Continue taking: Elestat  one drop per eye twice daily (this appears to be covered on the formulary).  - You can use an extra dose of the antihistamine, if needed, for breakthrough symptoms.  - Consider nasal saline rinses 1-2 times daily to remove allergens from the nasal cavities as well as help with mucous clearance (this is especially helpful to do before the nasal sprays are given). - Continue to follow with the Ophthalmologist as you are doing.  2. Shortness of breath/sensitivity to scents - did not tolerate Symbicort  - Lung testing looks excellent today.  - Levels were over 100%!  - Unfortunately, there is not much that we can do with sensitivity to scents/fumes aside from avoidance. - Try getting this portable air purifier to see if this can help: https://respiray.com - Consider getting a HEPA filter for rooms.  - Daily controller medication(s): Symbicort  80/4.67mcg two puffs twice daily with spacer - Prior to physical activity: albuterol  2 puffs 10-15 minutes before physical activity. - Rescue medications: albuterol  4 puffs every 4-6 hours as needed - Asthma control goals:  * Full participation in all desired activities (may need albuterol  before activity) * Albuterol  use two time or less a week on average (not counting use with activity) * Cough interfering with sleep two time or less a month * Oral steroids no more than once a year * No hospitalizations  3. Itching - Continue with the Claritin  daily to help with itching.  - Continue with clobetasol  shampoo to help with the scalp itching.  - Add on tobramycin  eye drops every 6 hours for one week.  - Add on lastacaft  one drop per  eye daily.   3. Concern for mast cell disease - We will send you to see an Academic Allergist.  - We are going to get some labs to look for mast cell activation syndrome.  - This involves looking for metabolites of mast cell activation, which involves urine collection as well. - Sometimes this takes a while to diagnose.  - We are also getting an immune workup to look at your immune system. - We are sending you to see an academic allergist at [facility].   4. Return in about 6 months (around 02/01/2025). You can have the follow up appointment with Dr. Iva or a Nurse Practicioner (our Nurse Practitioners are excellent and always have Physician oversight!).    Please inform us  of any Emergency Department visits, hospitalizations, or changes in symptoms. Call us  before going to the ED for breathing or allergy  symptoms since we might be able to fit you in for a sick visit. Feel free to contact us  anytime with any questions, problems, or concerns.  It was a pleasure to see you again today!  Websites that have reliable patient information: 1. American Academy of Asthma, Allergy , and Immunology: www.aaaai.org 2. Food Allergy  Research and Education (FARE): foodallergy.org 3. Mothers of Asthmatics: http://www.asthmacommunitynetwork.org 4. American College of Allergy , Asthma, and Immunology: www.acaai.org      "Like" us  on Facebook and Instagram for our latest updates!      A healthy democracy works best when [employer] participate! Make sure you are registered to vote! If you have moved or changed  any of your contact information, you will need to get this updated before voting! Scan the QR codes below to learn more!

## 2024-08-04 NOTE — [place] Notes (Unsigned)
 FOLLOW UP  Date of Service/Encounter:  08/04/24   Assessment:   Perennial allergic rhinitis (indoor molds and dust mites) - doing better on allergen immunotherapy   Multiple chemical sensitivity - recommended avoidance and initiation of Symbicort    Pruritus    Dry mouth secondary to antihistamines   Complicated past medical history, including anxiety and depression    Plan/Recommendations:   There are no Patient Instructions on file for this visit.   Subjective:   Michele [name] is a 58 y.o. female presenting today for follow up of  Chief Complaint  Patient presents with  . Follow-up    She could not last shot and she wants to discuss to allergy  because she felt worst. Last week, she had asthma flared up.    Michele [name] has a history of the following: Patient Active Problem List   Diagnosis Date Noted  . Chronic pruritus 08/06/2022  . Nonallergic rhinitis 08/06/2022  . Osteoma of skull 05/27/2017  . Abdominal bloating 08/26/2015  . Vaginitis and vulvovaginitis 03/01/2010  . HYPERCHOLESTEROLEMIA 01/02/2010  . DEPRESSION 09/07/2009  . PELVIC  PAIN 05/02/2009  . Candidiasis 04/22/2009  . VULVODYNIA UNSPECIFIED 04/22/2009  . Anxiety state 10/11/2008  . UTI'S, RECURRENT 10/11/2008  . BREAST MASS, RIGHT 07/29/2008  . ACNE NEC 08/14/2007  . History of urinary tract infection 08/14/2007    History obtained from: chart review and patient.  Discussed the use of AI scribe software for clinical note transcription with the patient and/or guardian, who gave verbal consent to proceed.  Michele [name] is a 58 y.o. female presenting for a follow up visit.  She was last seen in March 2025.  At that time, we continue with Claritin  and Flonase  and started Elestat  1 drop per eye twice daily.  We continued her on her allergy  shots.  For her shortness of breath, her levels were over 100%.  She continue with Symbicort  80 mcg 2 puffs twice daily.   Since the last visit, she has done well.    Discussed the use of AI scribe software for clinical note transcription with the patient, who gave verbal consent to proceed.  History of Present Illness   Michele [name] is a 58 year old female who presents with worsening allergy  symptoms despite treatment.  She experiences worsening allergy  symptoms despite ongoing treatment with allergy  shots. Known allergies to dust and mold have been worsened by environmental factors in her new home, including an uncovered vent leading to a crawl space. She reports that she suspects this has led to increased exposure to mold and mildew. This has caused choking and difficulty breathing.  She has heightened sensitivity to chemicals, colognes, detergents, and certain fabrics, causing her head to burn and tingle, and her left eye to twitch. She uses an inhaler and keeps an EpiPen  close due to severe reactions. Her symptoms have significantly impacted her quality of life, leading to isolation and inability to be around others.  She has a history of COVID-19 and subsequent vaccinations, after which she noticed a drastic worsening of her symptoms. She experiences burning and itching in her eyes and nose, and her scalp tingles and burns, especially after exposure to allergens. Frequent hair washing is necessary to alleviate discomfort.  She has tried various treatments including Symbicort , which she did not like, and currently uses albuterol  sparingly. Epinastine eye drops are used for her left eye, which is more sensitive than the right. Air purifiers have been ineffective, and she has not used a HEPA filter  or a neck air purifier due to concerns.  Her mother reports that she cannot sit on furniture or be near others due to her allergies. She mentions that her life has been severely affected, feeling like she lives in a 'bubble' and expressing distress over her condition.  She has a history of eye surgery for strabismus in childhood, and her left eye is particularly  sensitive to allergens. She has tried Pataday and epinastine drops for her eyes.       Asthma/Respiratory Symptom History: ***  Refused to use on t  Allergic Rhinitis Symptom History: ***  Michele [name] is on allergen immunotherapy. She receives one injection. Immunotherapy script #1 contains molds and dust mites. She currently receives 0.72mL of the GREEN vial (1/1,000). She started shots December of 2024 and not yet reached maintenance. She started on the Silver Vial (1:1,000,000) due to concern that she might have some kind of reaction.     Food Allergy  Symptom History: ***  Skin Symptom History: ***  GERD Symptom History: ***  Infection Symptom History: ***  Otherwise, there have been no changes to her past medical history, surgical history, family history, or social history.    Review of systems otherwise negative other than that mentioned in the HPI.    Objective:   There were no vitals taken for this visit. There is no height or weight on file to calculate BMI.    Physical Exam   Diagnostic studies:    Spirometry: results normal (FEV1: 2.20/107%, FVC: 2.84/111%, FEV1/FVC: 77%).    Spirometry consistent with normal pattern.   Allergy  Studies: none       Michele Shaggy, MD  [facility]  

## 2024-08-05 ENCOUNTER — Encounter: Payer: Self-pay | Admitting: Allergy & Immunology

## 2024-08-06 LAB — TRYPTASE: Tryptase: 5.1 ug/L (ref 2.2–13.2)

## 2024-08-06 NOTE — Addendum Note (Signed)
 Addended by: IVA MARTY SALTNESS on: 08/06/2024 02:32 PM   Modules accepted: Orders

## 2024-08-08 LAB — IGG 1, 2, 3, AND 4
IgG (Immunoglobin G), Serum: 955 mg/dL (ref 586–1602)
IgG, Subclass 1: 501 mg/dL (ref 248–810)
IgG, Subclass 2: 236 mg/dL (ref 130–555)
IgG, Subclass 3: 10 mg/dL — ABNORMAL LOW (ref 15–102)
IgG, Subclass 4: 43 mg/dL (ref 2–96)

## 2024-08-08 LAB — LYMPH ENUMERATION, BASIC & NK CELLS
% CD 3 Pos. Lymph.: 83.4 % (ref 57.5–86.2)
% CD 4 Pos. Lymph.: 36.9 % (ref 30.8–58.5)
% NK (CD56/16): 8.4 % (ref 1.4–19.4)
Ab NK (CD56/16): 134 /uL (ref 24–406)
Absolute CD 3: 1334 /uL (ref 622–2402)
Absolute CD 4 Helper: 590 /uL (ref 359–1519)
Basophils Absolute: 0 x10E3/uL (ref 0.0–0.2)
Basos: 1 %
CD19 % B Cell: 7.4 % (ref 3.3–25.4)
CD19 Abs: 118 /uL (ref 12–645)
CD4/CD8 Ratio: 0.78 — ABNORMAL LOW (ref 0.92–3.72)
CD8 % Suppressor T Cell: 47.2 % — ABNORMAL HIGH (ref 12.0–35.5)
CD8 T Cell Abs: 755 /uL (ref 109–897)
EOS (ABSOLUTE): 0 x10E3/uL (ref 0.0–0.4)
Eos: 0 %
Hematocrit: 43.3 % (ref 34.0–46.6)
Hemoglobin: 14.2 g/dL (ref 11.1–15.9)
Immature Grans (Abs): 0 x10E3/uL (ref 0.0–0.1)
Immature Granulocytes: 0 %
Lymphocytes Absolute: 1.6 x10E3/uL (ref 0.7–3.1)
Lymphs: 42 %
MCH: 29.8 pg (ref 26.6–33.0)
MCHC: 32.8 g/dL (ref 31.5–35.7)
MCV: 91 fL (ref 79–97)
Monocytes Absolute: 0.4 x10E3/uL (ref 0.1–0.9)
Monocytes: 9 %
Neutrophils Absolute: 1.8 x10E3/uL (ref 1.4–7.0)
Neutrophils: 48 %
Platelets: 255 x10E3/uL (ref 150–450)
RBC: 4.77 x10E6/uL (ref 3.77–5.28)
RDW: 13.6 % (ref 11.7–15.4)
WBC: 3.8 x10E3/uL (ref 3.4–10.8)

## 2024-08-08 LAB — STREP PNEUMONIAE 23 SEROTYPES IGG
Pneumo Ab Type 1*: <0.1 ug/mL — AB (ref >1.3)
Pneumo Ab Type 12 (12F)*: <0.1 ug/mL — AB (ref >1.3)
Pneumo Ab Type 14*: 0.2 ug/mL — AB (ref >1.3)
Pneumo Ab Type 17 (17F)*: <0.1 ug/mL — AB (ref >1.3)
Pneumo Ab Type 19 (19F)*: <0.1 ug/mL — AB (ref >1.3)
Pneumo Ab Type 2*: 0.4 ug/mL — AB (ref >1.3)
Pneumo Ab Type 20*: 0.2 ug/mL — AB (ref >1.3)
Pneumo Ab Type 22 (22F)*: <0.1 ug/mL — AB (ref >1.3)
Pneumo Ab Type 23 (23F)*: <0.1 ug/mL — AB (ref >1.3)
Pneumo Ab Type 26 (6B)*: <0.1 ug/mL — AB (ref >1.3)
Pneumo Ab Type 3*: <0.1 ug/mL — AB (ref >1.3)
Pneumo Ab Type 34 (10A)*: <0.1 ug/mL — AB (ref >1.3)
Pneumo Ab Type 4*: <0.1 ug/mL — AB (ref >1.3)
Pneumo Ab Type 43 (11A)*: <0.1 ug/mL — AB (ref >1.3)
Pneumo Ab Type 5*: <0.1 ug/mL — AB (ref >1.3)
Pneumo Ab Type 51 (7F)*: <0.1 ug/mL — AB (ref >1.3)
Pneumo Ab Type 54 (15B)*: <0.2 ug/mL — AB (ref >1.3)
Pneumo Ab Type 56 (18C)*: 0.2 ug/mL — AB (ref >1.3)
Pneumo Ab Type 57 (19A)*: 0.4 ug/mL — AB (ref >1.3)
Pneumo Ab Type 68 (9V)*: <0.1 ug/mL — AB (ref >1.3)
Pneumo Ab Type 70 (33F)*: 0.9 ug/mL — AB (ref >1.3)
Pneumo Ab Type 8*: <0.3 ug/mL — AB (ref >1.3)
Pneumo Ab Type 9 (9N)*: <0.1 ug/mL — AB (ref >1.3)

## 2024-08-08 LAB — IGG, IGA, IGM
IgA/Immunoglobulin A, Serum: 204 mg/dL (ref 87–352)
IgM (Immunoglobulin M), Srm: 38 mg/dL (ref 26–217)

## 2024-08-08 LAB — DIPHTHERIA / TETANUS ANTIBODY PANEL
Diphtheria Ab: 0.69 [IU]/mL (ref <0.10)
Tetanus Ab, IgG: 3.92 [IU]/mL (ref <0.10)

## 2024-08-10 ENCOUNTER — Telehealth: Payer: Self-pay | Admitting: Allergy & Immunology

## 2024-08-10 ENCOUNTER — Other Ambulatory Visit: Payer: Self-pay

## 2024-08-10 ENCOUNTER — Other Ambulatory Visit (HOSPITAL_BASED_OUTPATIENT_CLINIC_OR_DEPARTMENT_OTHER): Payer: Self-pay

## 2024-08-10 LAB — SPECIMEN STATUS

## 2024-08-10 LAB — SPECIMEN STATUS REPORT

## 2024-08-10 NOTE — Telephone Encounter (Signed)
 Patient called stating she is needing a prior authorization on Alcaftadine  eye drops.

## 2024-08-11 ENCOUNTER — Other Ambulatory Visit (HOSPITAL_BASED_OUTPATIENT_CLINIC_OR_DEPARTMENT_OTHER): Payer: Self-pay

## 2024-08-11 ENCOUNTER — Other Ambulatory Visit (HOSPITAL_COMMUNITY): Payer: Self-pay

## 2024-08-13 ENCOUNTER — Other Ambulatory Visit (HOSPITAL_BASED_OUTPATIENT_CLINIC_OR_DEPARTMENT_OTHER): Payer: Self-pay

## 2024-08-13 LAB — PROSTAGLANDIN D2, SERUM: Prostaglandin D2, serum: 25 pg/mL

## 2024-08-15 ENCOUNTER — Other Ambulatory Visit (HOSPITAL_BASED_OUTPATIENT_CLINIC_OR_DEPARTMENT_OTHER): Payer: Self-pay

## 2024-08-16 ENCOUNTER — Other Ambulatory Visit (HOSPITAL_BASED_OUTPATIENT_CLINIC_OR_DEPARTMENT_OTHER): Payer: Self-pay

## 2024-08-17 ENCOUNTER — Other Ambulatory Visit (HOSPITAL_BASED_OUTPATIENT_CLINIC_OR_DEPARTMENT_OTHER): Payer: Self-pay

## 2024-08-17 ENCOUNTER — Ambulatory Visit (INDEPENDENT_AMBULATORY_CARE_PROVIDER_SITE_OTHER)

## 2024-08-17 ENCOUNTER — Other Ambulatory Visit: Payer: Self-pay

## 2024-08-17 DIAGNOSIS — J309 Allergic rhinitis, unspecified: Secondary | ICD-10-CM | POA: Diagnosis not present

## 2024-08-17 MED ORDER — CITALOPRAM HYDROBROMIDE 20 MG PO TABS
20.0000 mg | ORAL_TABLET | Freq: Every day | ORAL | 1 refills | Status: AC
  Filled 2024-08-17 (×2): qty 30, 30d supply, fill #0

## 2024-08-18 ENCOUNTER — Other Ambulatory Visit (HOSPITAL_BASED_OUTPATIENT_CLINIC_OR_DEPARTMENT_OTHER): Payer: Self-pay

## 2024-08-18 LAB — N-METHYLHISTAMINE, 24 HR, U
Collection Duration (h): 24 h
Creatinine Concent. 24 Hr, U: 37 mg/dL
Creatinine, 24 Hour, U: 786 mg/(24.h) (ref 603–1783)
N-Methylhistamine, 24 Hr, U: 55 ug/g{creat} (ref 30–200)
Urine Volume (mL): 2125 mL

## 2024-08-18 LAB — PROSTAGLANDIN D2/CREATININE, U
Creatinine, Urine: 60 mg/dL
Prostaglandin D2, urine: 6.6 pg/mL
Prostaglandin D2/Cr Ratio: 11 ng/g

## 2024-08-18 LAB — LEUKOTRIENE E4, 24 HR, U
Collection Duration: 24 h
Creatinine Concentration,24 HR: 37 mg/dL
Creatinine, 24 HR, U: 786 mg/(24.h) (ref 603–1783)
Leukotriene E4, U: 57 pg/mg{creat} (ref <=104)
Urine Volume: 2125 mL

## 2024-08-19 ENCOUNTER — Other Ambulatory Visit (HOSPITAL_BASED_OUTPATIENT_CLINIC_OR_DEPARTMENT_OTHER): Payer: Self-pay

## 2024-08-19 ENCOUNTER — Ambulatory Visit: Payer: Self-pay | Admitting: Allergy & Immunology

## 2024-08-24 ENCOUNTER — Ambulatory Visit

## 2024-08-24 ENCOUNTER — Other Ambulatory Visit (HOSPITAL_BASED_OUTPATIENT_CLINIC_OR_DEPARTMENT_OTHER): Payer: Self-pay

## 2024-08-24 DIAGNOSIS — J309 Allergic rhinitis, unspecified: Secondary | ICD-10-CM

## 2024-08-26 ENCOUNTER — Other Ambulatory Visit (HOSPITAL_BASED_OUTPATIENT_CLINIC_OR_DEPARTMENT_OTHER): Payer: Self-pay

## 2024-08-26 MED ORDER — LORAZEPAM 2 MG PO TABS
2.0000 mg | ORAL_TABLET | Freq: Three times a day (TID) | ORAL | 0 refills | Status: DC
  Filled 2024-08-31: qty 90, 30d supply, fill #0

## 2024-08-27 ENCOUNTER — Other Ambulatory Visit (HOSPITAL_BASED_OUTPATIENT_CLINIC_OR_DEPARTMENT_OTHER): Payer: Self-pay

## 2024-08-27 LAB — OTHER LAB TEST: PDF: 0

## 2024-08-31 ENCOUNTER — Other Ambulatory Visit (HOSPITAL_COMMUNITY): Payer: Self-pay

## 2024-08-31 ENCOUNTER — Other Ambulatory Visit (HOSPITAL_BASED_OUTPATIENT_CLINIC_OR_DEPARTMENT_OTHER): Payer: Self-pay

## 2024-09-01 LAB — COLOGUARD: COLOGUARD: NEGATIVE

## 2024-09-02 ENCOUNTER — Ambulatory Visit (INDEPENDENT_AMBULATORY_CARE_PROVIDER_SITE_OTHER)

## 2024-09-02 DIAGNOSIS — J309 Allergic rhinitis, unspecified: Secondary | ICD-10-CM | POA: Diagnosis not present

## 2024-09-03 ENCOUNTER — Other Ambulatory Visit (HOSPITAL_BASED_OUTPATIENT_CLINIC_OR_DEPARTMENT_OTHER): Payer: Self-pay

## 2024-09-03 ENCOUNTER — Other Ambulatory Visit: Payer: Self-pay

## 2024-09-03 MED ORDER — FLUCONAZOLE 200 MG PO TABS
200.0000 mg | ORAL_TABLET | Freq: Every day | ORAL | 2 refills | Status: AC
  Filled 2024-09-03: qty 7, 7d supply, fill #0
  Filled 2024-09-28 (×2): qty 7, 7d supply, fill #1
  Filled 2024-10-14: qty 7, 7d supply, fill #2

## 2024-09-04 ENCOUNTER — Other Ambulatory Visit (HOSPITAL_BASED_OUTPATIENT_CLINIC_OR_DEPARTMENT_OTHER): Payer: Self-pay

## 2024-09-07 ENCOUNTER — Other Ambulatory Visit (HOSPITAL_BASED_OUTPATIENT_CLINIC_OR_DEPARTMENT_OTHER): Payer: Self-pay

## 2024-09-08 ENCOUNTER — Ambulatory Visit

## 2024-09-08 DIAGNOSIS — J309 Allergic rhinitis, unspecified: Secondary | ICD-10-CM | POA: Diagnosis not present

## 2024-09-09 DIAGNOSIS — J3089 Other allergic rhinitis: Secondary | ICD-10-CM | POA: Diagnosis not present

## 2024-09-09 DIAGNOSIS — J302 Other seasonal allergic rhinitis: Secondary | ICD-10-CM | POA: Diagnosis not present

## 2024-09-09 NOTE — [place] Notes (Signed)
 VIAL MADE 09-09-24

## 2024-09-14 ENCOUNTER — Other Ambulatory Visit (HOSPITAL_BASED_OUTPATIENT_CLINIC_OR_DEPARTMENT_OTHER): Payer: Self-pay

## 2024-09-14 ENCOUNTER — Other Ambulatory Visit (HOSPITAL_COMMUNITY): Payer: Self-pay

## 2024-09-14 ENCOUNTER — Ambulatory Visit (INDEPENDENT_AMBULATORY_CARE_PROVIDER_SITE_OTHER)

## 2024-09-14 DIAGNOSIS — J309 Allergic rhinitis, unspecified: Secondary | ICD-10-CM

## 2024-09-15 ENCOUNTER — Other Ambulatory Visit: Payer: Self-pay

## 2024-09-15 ENCOUNTER — Other Ambulatory Visit (HOSPITAL_BASED_OUTPATIENT_CLINIC_OR_DEPARTMENT_OTHER): Payer: Self-pay

## 2024-09-15 MED ORDER — OXYCODONE-ACETAMINOPHEN 10-325 MG PO TABS
1.0000 | ORAL_TABLET | Freq: Every day | ORAL | 0 refills | Status: DC | PRN
  Filled 2024-09-15: qty 30, 30d supply, fill #0

## 2024-09-15 MED ORDER — FUROSEMIDE 20 MG PO TABS
20.0000 mg | ORAL_TABLET | Freq: Every day | ORAL | 1 refills | Status: AC | PRN
  Filled 2024-09-15: qty 30, 30d supply, fill #0

## 2024-09-15 MED ORDER — NALOXONE HCL 4 MG/0.1ML NA LIQD
NASAL | 0 refills | Status: AC
  Filled 2024-09-15: qty 2, 2d supply, fill #0

## 2024-09-15 MED ORDER — TRAMADOL HCL 50 MG PO TABS
50.0000 mg | ORAL_TABLET | Freq: Three times a day (TID) | ORAL | 0 refills | Status: DC | PRN
  Filled 2024-09-23: qty 180, 30d supply, fill #0

## 2024-09-15 MED ORDER — ZOLPIDEM TARTRATE 5 MG PO TABS
5.0000 mg | ORAL_TABLET | Freq: Every evening | ORAL | 0 refills | Status: DC | PRN
  Filled 2024-09-23: qty 30, 30d supply, fill #0

## 2024-09-15 MED ORDER — MUPIROCIN 2 % EX OINT
TOPICAL_OINTMENT | CUTANEOUS | 0 refills | Status: DC
  Filled 2024-09-15: qty 22, 22d supply, fill #0
  Filled 2024-10-14: qty 22, 22d supply, fill #1
  Filled 2024-11-06: qty 22, 22d supply, fill #2
  Filled 2024-11-26: qty 22, 22d supply, fill #3

## 2024-09-15 MED ORDER — LORAZEPAM 2 MG PO TABS
2.0000 mg | ORAL_TABLET | Freq: Three times a day (TID) | ORAL | 0 refills | Status: DC
  Filled 2024-09-30: qty 90, 30d supply, fill #0

## 2024-09-16 ENCOUNTER — Other Ambulatory Visit (HOSPITAL_BASED_OUTPATIENT_CLINIC_OR_DEPARTMENT_OTHER): Payer: Self-pay

## 2024-09-17 ENCOUNTER — Other Ambulatory Visit (HOSPITAL_BASED_OUTPATIENT_CLINIC_OR_DEPARTMENT_OTHER): Payer: Self-pay

## 2024-09-21 ENCOUNTER — Other Ambulatory Visit (HOSPITAL_COMMUNITY): Payer: Self-pay

## 2024-09-21 ENCOUNTER — Other Ambulatory Visit (HOSPITAL_BASED_OUTPATIENT_CLINIC_OR_DEPARTMENT_OTHER): Payer: Self-pay

## 2024-09-22 ENCOUNTER — Other Ambulatory Visit (HOSPITAL_BASED_OUTPATIENT_CLINIC_OR_DEPARTMENT_OTHER): Payer: Self-pay

## 2024-09-22 ENCOUNTER — Other Ambulatory Visit: Payer: Self-pay

## 2024-09-22 ENCOUNTER — Ambulatory Visit

## 2024-09-22 DIAGNOSIS — J309 Allergic rhinitis, unspecified: Secondary | ICD-10-CM

## 2024-09-23 ENCOUNTER — Other Ambulatory Visit (HOSPITAL_BASED_OUTPATIENT_CLINIC_OR_DEPARTMENT_OTHER): Payer: Self-pay

## 2024-09-23 ENCOUNTER — Other Ambulatory Visit: Payer: Self-pay

## 2024-09-24 ENCOUNTER — Other Ambulatory Visit (HOSPITAL_BASED_OUTPATIENT_CLINIC_OR_DEPARTMENT_OTHER): Payer: Self-pay

## 2024-09-28 ENCOUNTER — Ambulatory Visit (INDEPENDENT_AMBULATORY_CARE_PROVIDER_SITE_OTHER)

## 2024-09-28 ENCOUNTER — Other Ambulatory Visit: Payer: Self-pay

## 2024-09-28 ENCOUNTER — Other Ambulatory Visit (HOSPITAL_BASED_OUTPATIENT_CLINIC_OR_DEPARTMENT_OTHER): Payer: Self-pay

## 2024-09-28 DIAGNOSIS — J309 Allergic rhinitis, unspecified: Secondary | ICD-10-CM

## 2024-09-29 ENCOUNTER — Other Ambulatory Visit (HOSPITAL_BASED_OUTPATIENT_CLINIC_OR_DEPARTMENT_OTHER): Payer: Self-pay

## 2024-09-30 ENCOUNTER — Other Ambulatory Visit (HOSPITAL_BASED_OUTPATIENT_CLINIC_OR_DEPARTMENT_OTHER): Payer: Self-pay

## 2024-10-02 ENCOUNTER — Other Ambulatory Visit (HOSPITAL_BASED_OUTPATIENT_CLINIC_OR_DEPARTMENT_OTHER): Payer: Self-pay

## 2024-10-07 ENCOUNTER — Other Ambulatory Visit: Payer: Self-pay

## 2024-10-07 ENCOUNTER — Other Ambulatory Visit (HOSPITAL_BASED_OUTPATIENT_CLINIC_OR_DEPARTMENT_OTHER): Payer: Self-pay

## 2024-10-07 MED ORDER — FLUCONAZOLE 200 MG PO TABS
200.0000 mg | ORAL_TABLET | Freq: Every day | ORAL | 2 refills | Status: DC
  Filled 2024-10-07: qty 7, 7d supply, fill #0
  Filled 2024-11-06: qty 7, 7d supply, fill #1
  Filled 2024-11-23: qty 7, 7d supply, fill #2

## 2024-10-07 MED ORDER — RETIN-A MICRO PUMP 0.06 % EX GEL
1.0000 g | Freq: Every day | CUTANEOUS | 2 refills | Status: AC
  Filled 2024-10-07: qty 50, 30d supply, fill #0
  Filled 2024-11-06: qty 50, 30d supply, fill #1
  Filled 2024-12-05: qty 50, 30d supply, fill #2

## 2024-10-08 ENCOUNTER — Other Ambulatory Visit (HOSPITAL_BASED_OUTPATIENT_CLINIC_OR_DEPARTMENT_OTHER): Payer: Self-pay

## 2024-10-09 ENCOUNTER — Other Ambulatory Visit (HOSPITAL_BASED_OUTPATIENT_CLINIC_OR_DEPARTMENT_OTHER): Payer: Self-pay

## 2024-10-12 ENCOUNTER — Ambulatory Visit (INDEPENDENT_AMBULATORY_CARE_PROVIDER_SITE_OTHER)

## 2024-10-12 DIAGNOSIS — J309 Allergic rhinitis, unspecified: Secondary | ICD-10-CM | POA: Diagnosis not present

## 2024-10-12 DIAGNOSIS — J302 Other seasonal allergic rhinitis: Secondary | ICD-10-CM | POA: Diagnosis not present

## 2024-10-14 ENCOUNTER — Other Ambulatory Visit: Payer: Self-pay

## 2024-10-14 ENCOUNTER — Other Ambulatory Visit (HOSPITAL_BASED_OUTPATIENT_CLINIC_OR_DEPARTMENT_OTHER): Payer: Self-pay

## 2024-10-14 ENCOUNTER — Other Ambulatory Visit (HOSPITAL_BASED_OUTPATIENT_CLINIC_OR_DEPARTMENT_OTHER): Payer: Self-pay | Admitting: Family Medicine

## 2024-10-14 DIAGNOSIS — Z780 Asymptomatic menopausal state: Secondary | ICD-10-CM

## 2024-10-15 ENCOUNTER — Other Ambulatory Visit: Payer: Self-pay

## 2024-10-15 ENCOUNTER — Other Ambulatory Visit (HOSPITAL_BASED_OUTPATIENT_CLINIC_OR_DEPARTMENT_OTHER): Payer: Self-pay

## 2024-10-15 MED ORDER — FUROSEMIDE 20 MG PO TABS
20.0000 mg | ORAL_TABLET | Freq: Every day | ORAL | 0 refills | Status: AC | PRN
  Filled 2024-10-15: qty 30, 30d supply, fill #0

## 2024-10-15 MED ORDER — TOPIRAMATE 50 MG PO TABS
50.0000 mg | ORAL_TABLET | Freq: Three times a day (TID) | ORAL | 3 refills | Status: AC | PRN
Start: 2024-10-14 — End: ?
  Filled 2024-10-15: qty 90, 30d supply, fill #0

## 2024-10-15 MED ORDER — OXYCODONE-ACETAMINOPHEN 10-325 MG PO TABS
1.0000 | ORAL_TABLET | Freq: Every day | ORAL | 0 refills | Status: DC | PRN
  Filled 2024-10-15 (×2): qty 30, 30d supply, fill #0

## 2024-10-15 MED ORDER — ZOLPIDEM TARTRATE 5 MG PO TABS
5.0000 mg | ORAL_TABLET | Freq: Every evening | ORAL | 0 refills | Status: AC | PRN
Start: 2024-10-14 — End: ?
  Filled 2024-10-15 – 2024-10-23 (×2): qty 30, 30d supply, fill #0

## 2024-10-15 MED ORDER — LORAZEPAM 2 MG PO TABS
2.0000 mg | ORAL_TABLET | Freq: Three times a day (TID) | ORAL | 0 refills | Status: AC
  Filled 2024-10-15 – 2024-10-28 (×3): qty 90, 30d supply, fill #0

## 2024-10-15 MED ORDER — TRAMADOL HCL 50 MG PO TABS
50.0000 mg | ORAL_TABLET | Freq: Three times a day (TID) | ORAL | 0 refills | Status: AC | PRN
Start: 2024-10-14 — End: ?
  Filled 2024-10-15 – 2024-10-23 (×2): qty 180, 30d supply, fill #0

## 2024-10-19 ENCOUNTER — Other Ambulatory Visit (HOSPITAL_BASED_OUTPATIENT_CLINIC_OR_DEPARTMENT_OTHER): Payer: Self-pay

## 2024-10-19 ENCOUNTER — Other Ambulatory Visit: Payer: Self-pay

## 2024-10-20 ENCOUNTER — Ambulatory Visit (INDEPENDENT_AMBULATORY_CARE_PROVIDER_SITE_OTHER)

## 2024-10-20 DIAGNOSIS — J309 Allergic rhinitis, unspecified: Secondary | ICD-10-CM

## 2024-10-20 DIAGNOSIS — J302 Other seasonal allergic rhinitis: Secondary | ICD-10-CM | POA: Diagnosis not present

## 2024-10-21 ENCOUNTER — Other Ambulatory Visit (HOSPITAL_BASED_OUTPATIENT_CLINIC_OR_DEPARTMENT_OTHER): Payer: Self-pay

## 2024-10-23 ENCOUNTER — Other Ambulatory Visit (HOSPITAL_BASED_OUTPATIENT_CLINIC_OR_DEPARTMENT_OTHER): Payer: Self-pay

## 2024-10-26 ENCOUNTER — Encounter: Payer: Self-pay | Admitting: Allergy & Immunology

## 2024-10-27 ENCOUNTER — Other Ambulatory Visit (HOSPITAL_BASED_OUTPATIENT_CLINIC_OR_DEPARTMENT_OTHER): Payer: Self-pay

## 2024-10-28 ENCOUNTER — Other Ambulatory Visit (HOSPITAL_BASED_OUTPATIENT_CLINIC_OR_DEPARTMENT_OTHER): Payer: Self-pay

## 2024-11-06 ENCOUNTER — Other Ambulatory Visit: Payer: Self-pay

## 2024-11-06 ENCOUNTER — Other Ambulatory Visit: Payer: Self-pay | Admitting: Allergy & Immunology

## 2024-11-06 ENCOUNTER — Other Ambulatory Visit (HOSPITAL_BASED_OUTPATIENT_CLINIC_OR_DEPARTMENT_OTHER): Payer: Self-pay

## 2024-11-06 MED ORDER — LORATADINE 10 MG PO TABS
10.0000 mg | ORAL_TABLET | Freq: Every day | ORAL | 2 refills | Status: AC
  Filled 2024-11-06: qty 30, 30d supply, fill #0

## 2024-11-06 MED ORDER — ALBUTEROL SULFATE HFA 108 (90 BASE) MCG/ACT IN AERS
1.0000 | INHALATION_SPRAY | Freq: Four times a day (QID) | RESPIRATORY_TRACT | 2 refills | Status: AC | PRN
  Filled 2024-11-06: qty 6.7, 15d supply, fill #0

## 2024-11-09 ENCOUNTER — Other Ambulatory Visit (HOSPITAL_BASED_OUTPATIENT_CLINIC_OR_DEPARTMENT_OTHER): Payer: Self-pay

## 2024-11-09 ENCOUNTER — Other Ambulatory Visit: Payer: Self-pay

## 2024-11-12 ENCOUNTER — Other Ambulatory Visit (HOSPITAL_BASED_OUTPATIENT_CLINIC_OR_DEPARTMENT_OTHER): Payer: Self-pay

## 2024-11-12 MED ORDER — ZOLPIDEM TARTRATE 5 MG PO TABS
5.0000 mg | ORAL_TABLET | Freq: Every evening | ORAL | 0 refills | Status: AC | PRN
  Filled 2024-11-20: qty 30, 30d supply, fill #0

## 2024-11-12 MED ORDER — LORAZEPAM 2 MG PO TABS
2.0000 mg | ORAL_TABLET | Freq: Three times a day (TID) | ORAL | 0 refills | Status: DC
  Filled 2024-11-27: qty 90, 30d supply, fill #0

## 2024-11-12 MED ORDER — TRAMADOL HCL 50 MG PO TABS
50.0000 mg | ORAL_TABLET | Freq: Three times a day (TID) | ORAL | 0 refills | Status: DC | PRN
  Filled 2024-11-20: qty 180, 30d supply, fill #0

## 2024-11-12 MED ORDER — OXYCODONE-ACETAMINOPHEN 10-325 MG PO TABS
1.0000 | ORAL_TABLET | Freq: Every day | ORAL | 0 refills | Status: DC | PRN
  Filled ????-??-??: fill #0

## 2024-11-13 ENCOUNTER — Other Ambulatory Visit (HOSPITAL_BASED_OUTPATIENT_CLINIC_OR_DEPARTMENT_OTHER): Payer: Self-pay

## 2024-11-13 MED ORDER — TRAZODONE HCL 50 MG PO TABS
100.0000 mg | ORAL_TABLET | Freq: Every day | ORAL | 1 refills | Status: AC
  Filled 2024-11-13 (×2): qty 60, 30d supply, fill #0
  Filled 2024-12-12: qty 60, 30d supply, fill #1

## 2024-11-14 ENCOUNTER — Other Ambulatory Visit (HOSPITAL_BASED_OUTPATIENT_CLINIC_OR_DEPARTMENT_OTHER): Payer: Self-pay

## 2024-11-15 ENCOUNTER — Other Ambulatory Visit (HOSPITAL_BASED_OUTPATIENT_CLINIC_OR_DEPARTMENT_OTHER): Payer: Self-pay

## 2024-11-15 ENCOUNTER — Other Ambulatory Visit: Payer: Self-pay | Admitting: Allergy & Immunology

## 2024-11-16 ENCOUNTER — Other Ambulatory Visit: Payer: Self-pay

## 2024-11-16 ENCOUNTER — Other Ambulatory Visit (HOSPITAL_BASED_OUTPATIENT_CLINIC_OR_DEPARTMENT_OTHER): Payer: Self-pay

## 2024-11-16 MED ORDER — FLUTICASONE PROPIONATE 50 MCG/ACT NA SUSP
1.0000 | Freq: Every day | NASAL | 5 refills | Status: AC
  Filled 2024-11-16: qty 16, 60d supply, fill #0

## 2024-11-18 ENCOUNTER — Other Ambulatory Visit (HOSPITAL_BASED_OUTPATIENT_CLINIC_OR_DEPARTMENT_OTHER): Payer: Self-pay

## 2024-11-20 ENCOUNTER — Other Ambulatory Visit (HOSPITAL_BASED_OUTPATIENT_CLINIC_OR_DEPARTMENT_OTHER): Payer: Self-pay

## 2024-11-23 ENCOUNTER — Other Ambulatory Visit (HOSPITAL_BASED_OUTPATIENT_CLINIC_OR_DEPARTMENT_OTHER): Payer: Self-pay

## 2024-11-24 ENCOUNTER — Other Ambulatory Visit (HOSPITAL_BASED_OUTPATIENT_CLINIC_OR_DEPARTMENT_OTHER): Payer: Self-pay

## 2024-11-25 ENCOUNTER — Other Ambulatory Visit (HOSPITAL_BASED_OUTPATIENT_CLINIC_OR_DEPARTMENT_OTHER): Payer: Self-pay

## 2024-11-26 ENCOUNTER — Other Ambulatory Visit (HOSPITAL_BASED_OUTPATIENT_CLINIC_OR_DEPARTMENT_OTHER): Payer: Self-pay

## 2024-11-27 ENCOUNTER — Other Ambulatory Visit (HOSPITAL_BASED_OUTPATIENT_CLINIC_OR_DEPARTMENT_OTHER): Payer: Self-pay

## 2024-12-03 ENCOUNTER — Other Ambulatory Visit (HOSPITAL_BASED_OUTPATIENT_CLINIC_OR_DEPARTMENT_OTHER): Payer: Self-pay

## 2024-12-05 ENCOUNTER — Other Ambulatory Visit (HOSPITAL_BASED_OUTPATIENT_CLINIC_OR_DEPARTMENT_OTHER): Payer: Self-pay

## 2024-12-06 ENCOUNTER — Other Ambulatory Visit: Payer: Self-pay

## 2024-12-07 ENCOUNTER — Other Ambulatory Visit (HOSPITAL_BASED_OUTPATIENT_CLINIC_OR_DEPARTMENT_OTHER): Payer: Self-pay

## 2024-12-07 ENCOUNTER — Other Ambulatory Visit: Payer: Self-pay

## 2024-12-08 ENCOUNTER — Other Ambulatory Visit (HOSPITAL_BASED_OUTPATIENT_CLINIC_OR_DEPARTMENT_OTHER): Payer: Self-pay

## 2024-12-09 ENCOUNTER — Other Ambulatory Visit (HOSPITAL_BASED_OUTPATIENT_CLINIC_OR_DEPARTMENT_OTHER): Payer: Self-pay

## 2024-12-10 ENCOUNTER — Other Ambulatory Visit (HOSPITAL_BASED_OUTPATIENT_CLINIC_OR_DEPARTMENT_OTHER): Payer: Self-pay

## 2024-12-10 MED ORDER — FUROSEMIDE 20 MG PO TABS
20.0000 mg | ORAL_TABLET | Freq: Every day | ORAL | 2 refills | Status: AC | PRN
  Filled 2024-12-10: qty 30, 30d supply, fill #0

## 2024-12-10 MED ORDER — BUPROPION HCL ER (SR) 100 MG PO TB12
100.0000 mg | ORAL_TABLET | Freq: Two times a day (BID) | ORAL | 2 refills | Status: AC
  Filled 2024-12-10: qty 60, 30d supply, fill #0

## 2024-12-10 MED ORDER — FLUTICASONE PROPIONATE 50 MCG/ACT NA SUSP
1.0000 | Freq: Two times a day (BID) | NASAL | 2 refills | Status: AC | PRN
  Filled 2024-12-10: qty 16, 30d supply, fill #0

## 2024-12-10 MED ORDER — HYDROCHLOROTHIAZIDE 25 MG PO TABS
25.0000 mg | ORAL_TABLET | Freq: Every day | ORAL | 2 refills | Status: AC
  Filled 2024-12-10: qty 30, 30d supply, fill #0

## 2024-12-10 MED ORDER — FLUCONAZOLE 200 MG PO TABS
200.0000 mg | ORAL_TABLET | Freq: Every day | ORAL | 2 refills | Status: AC
  Filled 2024-12-10: qty 7, 7d supply, fill #0

## 2024-12-10 MED ORDER — BUPROPION HCL ER (SR) 150 MG PO TB12
150.0000 mg | ORAL_TABLET | Freq: Two times a day (BID) | ORAL | 2 refills | Status: AC
  Filled 2024-12-10: qty 60, 30d supply, fill #0

## 2024-12-11 ENCOUNTER — Encounter (HOSPITAL_BASED_OUTPATIENT_CLINIC_OR_DEPARTMENT_OTHER): Payer: Self-pay

## 2024-12-11 ENCOUNTER — Other Ambulatory Visit (HOSPITAL_BASED_OUTPATIENT_CLINIC_OR_DEPARTMENT_OTHER): Payer: Self-pay

## 2024-12-11 MED ORDER — TRAMADOL HCL 50 MG PO TABS
50.0000 mg | ORAL_TABLET | Freq: Three times a day (TID) | ORAL | 0 refills | Status: AC | PRN
  Filled 2024-12-18: qty 180, 30d supply, fill #0

## 2024-12-11 MED ORDER — OXYCODONE-ACETAMINOPHEN 10-325 MG PO TABS
1.0000 | ORAL_TABLET | Freq: Every day | ORAL | 0 refills | Status: AC | PRN
  Filled 2024-12-14: qty 30, 30d supply, fill #0

## 2024-12-11 MED ORDER — ZOLPIDEM TARTRATE 5 MG PO TABS
5.0000 mg | ORAL_TABLET | Freq: Every day | ORAL | 0 refills | Status: AC
  Filled 2024-12-18: qty 30, 30d supply, fill #0

## 2024-12-11 MED ORDER — SERTRALINE HCL 50 MG PO TABS
50.0000 mg | ORAL_TABLET | Freq: Every day | ORAL | 1 refills | Status: AC
  Filled 2024-12-11: qty 30, 30d supply, fill #0

## 2024-12-11 MED ORDER — TRAZODONE HCL 50 MG PO TABS: 100.0000 mg | ORAL_TABLET | Freq: Every day | ORAL | 1 refills | Status: AC

## 2024-12-11 MED ORDER — LORAZEPAM 2 MG PO TABS
2.0000 mg | ORAL_TABLET | Freq: Three times a day (TID) | ORAL | 0 refills | Status: AC
  Filled 2024-12-28: qty 90, 30d supply, fill #0

## 2024-12-12 ENCOUNTER — Other Ambulatory Visit (HOSPITAL_BASED_OUTPATIENT_CLINIC_OR_DEPARTMENT_OTHER): Payer: Self-pay

## 2024-12-12 ENCOUNTER — Encounter (HOSPITAL_BASED_OUTPATIENT_CLINIC_OR_DEPARTMENT_OTHER): Payer: Self-pay

## 2024-12-13 ENCOUNTER — Other Ambulatory Visit (HOSPITAL_BASED_OUTPATIENT_CLINIC_OR_DEPARTMENT_OTHER): Payer: Self-pay

## 2024-12-14 ENCOUNTER — Other Ambulatory Visit: Payer: Self-pay

## 2024-12-14 ENCOUNTER — Other Ambulatory Visit (HOSPITAL_BASED_OUTPATIENT_CLINIC_OR_DEPARTMENT_OTHER): Payer: Self-pay

## 2024-12-15 ENCOUNTER — Other Ambulatory Visit (HOSPITAL_BASED_OUTPATIENT_CLINIC_OR_DEPARTMENT_OTHER): Payer: Self-pay

## 2024-12-16 ENCOUNTER — Other Ambulatory Visit (HOSPITAL_BASED_OUTPATIENT_CLINIC_OR_DEPARTMENT_OTHER): Payer: Self-pay

## 2024-12-16 ENCOUNTER — Ambulatory Visit: Admitting: Obstetrics and Gynecology

## 2024-12-18 ENCOUNTER — Other Ambulatory Visit: Payer: Self-pay

## 2024-12-19 ENCOUNTER — Other Ambulatory Visit (HOSPITAL_BASED_OUTPATIENT_CLINIC_OR_DEPARTMENT_OTHER): Payer: Self-pay

## 2024-12-21 ENCOUNTER — Other Ambulatory Visit: Payer: Self-pay

## 2024-12-26 ENCOUNTER — Other Ambulatory Visit (HOSPITAL_BASED_OUTPATIENT_CLINIC_OR_DEPARTMENT_OTHER): Payer: Self-pay

## 2024-12-27 ENCOUNTER — Other Ambulatory Visit: Payer: Self-pay

## 2024-12-28 ENCOUNTER — Other Ambulatory Visit (HOSPITAL_BASED_OUTPATIENT_CLINIC_OR_DEPARTMENT_OTHER): Payer: Self-pay

## 2024-12-29 ENCOUNTER — Other Ambulatory Visit (HOSPITAL_BASED_OUTPATIENT_CLINIC_OR_DEPARTMENT_OTHER): Payer: Self-pay

## 2024-12-31 ENCOUNTER — Other Ambulatory Visit (HOSPITAL_BASED_OUTPATIENT_CLINIC_OR_DEPARTMENT_OTHER): Payer: Self-pay

## 2025-02-09 ENCOUNTER — Ambulatory Visit: Admitting: Allergy & Immunology

## 2025-04-27 ENCOUNTER — Other Ambulatory Visit (HOSPITAL_BASED_OUTPATIENT_CLINIC_OR_DEPARTMENT_OTHER)
